# Patient Record
Sex: Female | Born: 1990 | Race: White | Hispanic: No | Marital: Single | Smoking: Never smoker
Health system: Southern US, Community
[De-identification: ages and names within clinical notes are randomized; demographics above are authoritative.]

## PROBLEM LIST (undated history)

## (undated) DIAGNOSIS — G43909 Migraine, unspecified, not intractable, without status migrainosus: Secondary | ICD-10-CM

## (undated) HISTORY — DX: Migraine, unspecified, not intractable, without status migrainosus: G43.909

## (undated) HISTORY — PX: WISDOM TOOTH EXTRACTION: SHX21

---
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
## 2012-04-06 DIAGNOSIS — M79676 Pain in unspecified toe(s): Secondary | ICD-10-CM | POA: Insufficient documentation

## 2012-04-06 DIAGNOSIS — L600 Ingrowing nail: Secondary | ICD-10-CM | POA: Insufficient documentation

## 2015-04-26 ENCOUNTER — Encounter: Payer: Self-pay | Admitting: Family Medicine

## 2015-04-26 ENCOUNTER — Ambulatory Visit (INDEPENDENT_AMBULATORY_CARE_PROVIDER_SITE_OTHER): Payer: 59 | Admitting: Family Medicine

## 2015-04-26 VITALS — BP 112/68 | HR 88 | Ht 60.0 in | Wt 148.4 lb

## 2015-04-26 DIAGNOSIS — Z7189 Other specified counseling: Secondary | ICD-10-CM | POA: Diagnosis not present

## 2015-04-26 DIAGNOSIS — G4700 Insomnia, unspecified: Secondary | ICD-10-CM | POA: Diagnosis not present

## 2015-04-26 DIAGNOSIS — G43919 Migraine, unspecified, intractable, without status migrainosus: Secondary | ICD-10-CM | POA: Diagnosis not present

## 2015-04-26 DIAGNOSIS — Z7689 Persons encountering health services in other specified circumstances: Secondary | ICD-10-CM

## 2015-04-26 MED ORDER — ZOLPIDEM TARTRATE 5 MG PO TABS: 5.0000 mg | ORAL_TABLET | Freq: Every evening | ORAL | Status: DC | PRN

## 2015-04-26 NOTE — Progress Notes (Signed)
Name: Candice Bernard   MRN: [id number]    DOB: 1991-01-31   Date:04/26/2015       Progress Note  Subjective  Chief Complaint  Chief Complaint  Patient presents with  . New Evaluation    previously seen at [facility]  . Migraine    currently seeing Dr. Bobby Rumpf, neurologist in [place]    Migraine  This is a chronic problem. The current episode started more than 1 year ago. The problem occurs intermittently. The problem has been unchanged. The pain is located in the occipital, frontal and bilateral region. The quality of the pain is described as throbbing. The pain is at a severity of 8/10. The pain is moderate. Associated symptoms include insomnia, phonophobia and photophobia. Pertinent negatives include no abdominal pain, back pain, blurred vision, coughing, dizziness, ear pain, eye watering, fever, nausea, neck pain, sore throat, tingling, tinnitus, visual change or weight loss. The symptoms are aggravated by food and fatigue (insomnia). She has tried antidepressants and triptans (topamax/ muscle relaxant) for the symptoms. The treatment provided moderate relief. Her past medical history is significant for migraine headaches.    No problem-specific assessment & plan notes found for this encounter.   Past Medical History  Diagnosis Date  . Migraine     Past Surgical History  Procedure Laterality Date  . Wisdom tooth extraction      Family History  Problem Relation Age of Onset  . Colitis Father     Social History   Social History  . Marital Status: Single    Spouse Name: N/A  . Number of Children: N/A  . Years of Education: N/A   Occupational History  . Not on file.   Social History Main Topics  . Smoking status: Never Smoker   . Smokeless tobacco: Not on file  . Alcohol Use: 0.0 oz/week    0 Standard drinks or equivalent per week     Comment: rare  . Drug Use: No  . Sexual Activity: Not on file   Other Topics Concern  . Not on file    Social History Narrative    No Known Allergies   Review of Systems  Constitutional: Negative for fever, chills, weight loss and malaise/fatigue.  HENT: Negative for ear discharge, ear pain, sore throat and tinnitus.   Eyes: Positive for photophobia. Negative for blurred vision.  Respiratory: Negative for cough, sputum production, shortness of breath and wheezing.   Cardiovascular: Negative for chest pain, palpitations and leg swelling.  Gastrointestinal: Negative for heartburn, nausea, abdominal pain, diarrhea, constipation, blood in stool and melena.  Genitourinary: Negative for dysuria, urgency, frequency and hematuria.  Musculoskeletal: Negative for myalgias, back pain, joint pain and neck pain.  Skin: Negative for rash.  Neurological: Negative for dizziness, tingling, sensory change, focal weakness and headaches.  Endo/Heme/Allergies: Negative for environmental allergies and polydipsia. Does not bruise/bleed easily.  Psychiatric/Behavioral: Negative for depression and suicidal ideas. The patient has insomnia. The patient is not nervous/anxious.      Objective  Filed Vitals:   04/26/15 1441  BP: 112/68  Pulse: 88  Height: 5' (1.524 m)  Weight: 148 lb 6.4 oz (67.314 kg)    Physical Exam  Constitutional: She is well-developed, well-nourished, and in no distress. No distress.  HENT:  Head: Normocephalic and atraumatic.  Right Ear: External ear normal.  Left Ear: External ear normal.  Nose: Nose normal.  Mouth/Throat: Oropharynx is clear and moist.  Eyes: Conjunctivae and EOM are normal. Pupils are equal, round, and  reactive to light. Right eye exhibits no discharge. Left eye exhibits no discharge.  Neck: Normal range of motion. Neck supple. No JVD present. No thyromegaly present.  Cardiovascular: Normal rate, regular rhythm, normal heart sounds and intact distal pulses.  Exam reveals no gallop and no friction rub.   No murmur heard. Pulmonary/Chest: Effort normal and  breath sounds normal.  Abdominal: Soft. Bowel sounds are normal. She exhibits no mass. There is no tenderness. There is no guarding.  Musculoskeletal: Normal range of motion. She exhibits no edema.  Lymphadenopathy:    She has no cervical adenopathy.  Neurological: She is alert. She has normal reflexes.  Skin: Skin is warm and dry. She is not diaphoretic.  Psychiatric: Mood and affect normal.  Nursing note and vitals reviewed.     Assessment & Plan  Problem List Items Addressed This Visit    None    Visit Diagnoses    Encounter to establish care with new doctor    -  Primary    shift change        Relevant Medications    zolpidem (AMBIEN) 5 MG tablet    Insomnia        Relevant Medications    zolpidem (AMBIEN) 5 MG tablet         Dr. Macon [facility]  04/26/2015

## 2015-05-21 DIAGNOSIS — G44219 Episodic tension-type headache, not intractable: Secondary | ICD-10-CM | POA: Diagnosis not present

## 2015-05-21 DIAGNOSIS — G43009 Migraine without aura, not intractable, without status migrainosus: Secondary | ICD-10-CM | POA: Diagnosis not present

## 2015-06-29 ENCOUNTER — Ambulatory Visit (INDEPENDENT_AMBULATORY_CARE_PROVIDER_SITE_OTHER): Payer: 59 | Admitting: Family Medicine

## 2015-06-29 ENCOUNTER — Encounter: Payer: Self-pay | Admitting: Family Medicine

## 2015-06-29 VITALS — BP 130/80 | HR 88 | Ht 60.0 in | Wt 148.0 lb

## 2015-06-29 DIAGNOSIS — G4700 Insomnia, unspecified: Secondary | ICD-10-CM

## 2015-06-29 MED ORDER — ZOLPIDEM TARTRATE 5 MG PO TABS: 5.0000 mg | ORAL_TABLET | Freq: Every evening | ORAL | Status: DC | PRN

## 2015-06-29 NOTE — Progress Notes (Signed)
Name: Candice Bernard   MRN: [id number]    DOB: [birth year]   Date:06/29/2015       Progress Note  Subjective  Chief Complaint  Chief Complaint  Patient presents with  . Insomnia    follow up to starting Ambien- "still not sleeping well"    Insomnia Primary symptoms: fragmented sleep, premature morning awakening, malaise/fatigue.  The current episode started more than one month. The onset quality is gradual. The problem occurs intermittently. The symptoms are aggravated by work stress (12 hour shifts with shift change effects). The treatment provided mild Lorrin Mais works but need more frequency) relief. How long after going to bed to you fall asleep: 15-30 minutes.   PMH includes: no depression, chronic pain.    No problem-specific assessment & plan notes found for this encounter.   Past Medical History  Diagnosis Date  . Migraine     Past Surgical History  Procedure Laterality Date  . Wisdom tooth extraction      Family History  Problem Relation Age of Onset  . Colitis Father     Social History   Social History  . Marital Status: Single    Spouse Name: N/A  . Number of Children: N/A  . Years of Education: N/A   Occupational History  . Not on file.   Social History Main Topics  . Smoking status: Never Smoker   . Smokeless tobacco: Not on file  . Alcohol Use: 0.0 oz/week    0 Standard drinks or equivalent per week     Comment: rare  . Drug Use: No  . Sexual Activity: No   Other Topics Concern  . Not on file   Social History Narrative    No Known Allergies   Review of Systems  Constitutional: Positive for malaise/fatigue. Negative for fever, chills and weight loss.  HENT: Negative for ear discharge, ear pain and sore throat.   Eyes: Negative for blurred vision.  Respiratory: Negative for cough, sputum production, shortness of breath and wheezing.   Cardiovascular: Negative for chest pain, palpitations and leg swelling.  Gastrointestinal: Negative  for heartburn, nausea, abdominal pain, diarrhea, constipation, blood in stool and melena.  Genitourinary: Negative for dysuria, urgency, frequency and hematuria.  Musculoskeletal: Negative for myalgias, back pain, joint pain and neck pain.  Skin: Negative for rash.  Neurological: Negative for dizziness, tingling, sensory change, focal weakness and headaches.  Endo/Heme/Allergies: Negative for environmental allergies and polydipsia. Does not bruise/bleed easily.  Psychiatric/Behavioral: Negative for depression and suicidal ideas. The patient has insomnia. The patient is not nervous/anxious.      Objective  Filed Vitals:   06/29/15 0905  BP: 130/80  Pulse: 88  Height: 5' (1.524 m)  Weight: 148 lb (67.132 kg)    Physical Exam  Constitutional: She is well-developed, well-nourished, and in no distress. No distress.  HENT:  Head: Normocephalic and atraumatic.  Right Ear: External ear normal.  Left Ear: External ear normal.  Nose: Nose normal.  Mouth/Throat: Oropharynx is clear and moist.  Eyes: Conjunctivae and EOM are normal. Pupils are equal, round, and reactive to light. Right eye exhibits no discharge. Left eye exhibits no discharge.  Neck: Normal range of motion. Neck supple. No JVD present. No thyromegaly present.  Cardiovascular: Normal rate, regular rhythm, normal heart sounds and intact distal pulses.  Exam reveals no gallop and no friction rub.   No murmur heard. Pulmonary/Chest: Effort normal and breath sounds normal.  Abdominal: Soft. Bowel sounds are normal. She exhibits no mass.  There is no tenderness. There is no guarding.  Musculoskeletal: Normal range of motion. She exhibits no edema.  Lymphadenopathy:    She has no cervical adenopathy.  Neurological: She is alert.  Skin: Skin is warm and dry. She is not diaphoretic.  Psychiatric: Mood and affect normal.  Nursing note and vitals reviewed.     Assessment & Plan  Problem List Items Addressed This Visit    None     Visit Diagnoses    Insomnia    -  Primary    shift change    Relevant Medications    zolpidem (AMBIEN) 5 MG tablet    shift change        Relevant Medications    zolpidem (AMBIEN) 5 MG tablet      Called pharmacy and d/c Ambien 5mg  # 6 per 30 days. Pt to get neurologist to take this med over   Dr. Otilio Miu [facility]  06/29/2015

## 2015-08-10 DIAGNOSIS — G43019 Migraine without aura, intractable, without status migrainosus: Secondary | ICD-10-CM | POA: Diagnosis not present

## 2015-08-10 DIAGNOSIS — G44219 Episodic tension-type headache, not intractable: Secondary | ICD-10-CM | POA: Diagnosis not present

## 2015-08-10 DIAGNOSIS — G4700 Insomnia, unspecified: Secondary | ICD-10-CM | POA: Diagnosis not present

## 2015-10-19 DIAGNOSIS — G44219 Episodic tension-type headache, not intractable: Secondary | ICD-10-CM | POA: Diagnosis not present

## 2015-10-19 DIAGNOSIS — G43009 Migraine without aura, not intractable, without status migrainosus: Secondary | ICD-10-CM | POA: Diagnosis not present

## 2015-11-29 ENCOUNTER — Other Ambulatory Visit: Payer: Self-pay

## 2015-11-29 DIAGNOSIS — Z3041 Encounter for surveillance of contraceptive pills: Secondary | ICD-10-CM

## 2015-11-29 MED ORDER — LEVONORGEST-ETH ESTRAD 91-DAY 0.15-0.03 MG PO TABS: 1.0000 | ORAL_TABLET | Freq: Every day | ORAL | 0 refills | Status: DC

## 2015-12-20 DIAGNOSIS — G43019 Migraine without aura, intractable, without status migrainosus: Secondary | ICD-10-CM | POA: Diagnosis not present

## 2015-12-20 DIAGNOSIS — G44219 Episodic tension-type headache, not intractable: Secondary | ICD-10-CM | POA: Diagnosis not present

## 2016-01-23 ENCOUNTER — Other Ambulatory Visit: Payer: Self-pay | Admitting: Family Medicine

## 2016-01-23 DIAGNOSIS — Z3041 Encounter for surveillance of contraceptive pills: Secondary | ICD-10-CM

## 2016-01-25 ENCOUNTER — Telehealth: Payer: Self-pay | Admitting: Emergency Medicine

## 2016-01-25 ENCOUNTER — Ambulatory Visit
Admission: EM | Admit: 2016-01-25 | Discharge: 2016-01-25 | Disposition: A | Discharge status: Home / Self Care | Payer: 59 | Attending: Family Medicine | Admitting: Family Medicine

## 2016-01-25 DIAGNOSIS — R197 Diarrhea, unspecified: Secondary | ICD-10-CM

## 2016-01-25 DIAGNOSIS — R319 Hematuria, unspecified: Secondary | ICD-10-CM

## 2016-01-25 DIAGNOSIS — N390 Urinary tract infection, site not specified: Secondary | ICD-10-CM

## 2016-01-25 LAB — URINALYSIS COMPLETE WITH MICROSCOPIC (ARMC ONLY)
Bilirubin Urine: NEGATIVE
GLUCOSE, UA: NEGATIVE mg/dL
KETONES UR: NEGATIVE mg/dL
LEUKOCYTES UA: NEGATIVE
NITRITE: NEGATIVE
Protein, ur: NEGATIVE mg/dL
SPECIFIC GRAVITY, URINE: 1.01 (ref 1.005–1.030)
pH: 6 (ref 5.0–8.0)

## 2016-01-25 LAB — CBC WITH DIFFERENTIAL/PLATELET
BASOS ABS: 0 10*3/uL (ref 0–0.1)
Basophils Relative: 1 %
EOS PCT: 1 %
Eosinophils Absolute: 0.1 10*3/uL (ref 0–0.7)
HEMATOCRIT: 38.5 % (ref 35.0–47.0)
Hemoglobin: 12.8 g/dL (ref 12.0–16.0)
LYMPHS PCT: 30 %
Lymphs Abs: 1.6 10*3/uL (ref 1.0–3.6)
MCH: 28.5 pg (ref 26.0–34.0)
MCHC: 33.2 g/dL (ref 32.0–36.0)
MCV: 85.9 fL (ref 80.0–100.0)
MONO ABS: 0.6 10*3/uL (ref 0.2–0.9)
MONOS PCT: 12 %
Neutro Abs: 3 10*3/uL (ref 1.4–6.5)
Neutrophils Relative %: 56 %
PLATELETS: 250 10*3/uL (ref 150–440)
RBC: 4.49 MIL/uL (ref 3.80–5.20)
RDW: 13.6 % (ref 11.5–14.5)
WBC: 5.2 10*3/uL (ref 3.6–11.0)

## 2016-01-25 LAB — COMPREHENSIVE METABOLIC PANEL
ALBUMIN: 3.6 g/dL (ref 3.5–5.0)
ALK PHOS: 56 U/L (ref 38–126)
ALT: 20 U/L (ref 14–54)
ANION GAP: 8 (ref 5–15)
AST: 24 U/L (ref 15–41)
BILIRUBIN TOTAL: 0.5 mg/dL (ref 0.3–1.2)
CALCIUM: 8.4 mg/dL — AB (ref 8.9–10.3)
CO2: 23 mmol/L (ref 22–32)
CREATININE: 0.72 mg/dL (ref 0.44–1.00)
Chloride: 103 mmol/L (ref 101–111)
GFR calc Af Amer: >60 mL/min (ref >60)
GFR calc non Af Amer: >60 mL/min (ref >60)
GLUCOSE: 97 mg/dL (ref 65–99)
Potassium: 3.6 mmol/L (ref 3.5–5.1)
SODIUM: 134 mmol/L — AB (ref 135–145)
TOTAL PROTEIN: 7.3 g/dL (ref 6.5–8.1)

## 2016-01-25 LAB — GASTROINTESTINAL PANEL BY PCR, STOOL (REPLACES STOOL CULTURE)
ADENOVIRUS F40/41: NOT DETECTED
ASTROVIRUS: NOT DETECTED
CYCLOSPORA CAYETANENSIS: NOT DETECTED
Campylobacter species: DETECTED — AB
Cryptosporidium: NOT DETECTED
ENTAMOEBA HISTOLYTICA: NOT DETECTED
ENTEROAGGREGATIVE E COLI (EAEC): NOT DETECTED
ENTEROPATHOGENIC E COLI (EPEC): NOT DETECTED
ENTEROTOXIGENIC E COLI (ETEC): NOT DETECTED
GIARDIA LAMBLIA: NOT DETECTED
NOROVIRUS GI/GII: NOT DETECTED
Plesimonas shigelloides: NOT DETECTED
Rotavirus A: NOT DETECTED
SAPOVIRUS (I, II, IV, AND V): NOT DETECTED
SHIGA LIKE TOXIN PRODUCING E COLI (STEC): NOT DETECTED
Salmonella species: NOT DETECTED
Shigella/Enteroinvasive E coli (EIEC): NOT DETECTED
VIBRIO CHOLERAE: NOT DETECTED
VIBRIO SPECIES: NOT DETECTED
Yersinia enterocolitica: NOT DETECTED

## 2016-01-25 LAB — PREGNANCY, URINE: PREG TEST UR: NEGATIVE

## 2016-01-25 LAB — C DIFFICILE QUICK SCREEN W PCR REFLEX
C DIFFICLE (CDIFF) ANTIGEN: NEGATIVE
C Diff interpretation: NOT DETECTED
C Diff toxin: NEGATIVE

## 2016-01-25 LAB — LIPASE, BLOOD: Lipase: 17 U/L (ref 11–51)

## 2016-01-25 MED ORDER — ONDANSETRON 4 MG PO TBDP: 4.0000 mg | ORAL_TABLET | Freq: Three times a day (TID) | ORAL | 0 refills | Status: DC | PRN

## 2016-01-25 MED ORDER — CIPROFLOXACIN HCL 500 MG PO TABS: 500.0000 mg | ORAL_TABLET | Freq: Two times a day (BID) | ORAL | 0 refills | Status: AC

## 2016-01-25 NOTE — Discharge Instructions (Signed)
Take medication as prescribed. Rest. Drink plenty of fluids. Follow BRAT diet.  Follow up with your primary care physician this week. Return to Urgent care or ER for new or worsening concerns.

## 2016-01-25 NOTE — Telephone Encounter (Signed)
Called and discussed laboratory results with patient. Patient c.diff negative. As previously discussed, we were awaiting C. difficile results to determine antibiotic use. Will place patient on oral Cipro as concerned for a urinary tract infection as well as infectious diarrhea. Encouraged supportive care. Patient prescription sent to patient pharmacy of request of [facility] in [place].Discussed indication, risks and benefits of medications with patient.

## 2016-01-25 NOTE — ED Triage Notes (Addendum)
Pt c/o diarrhea, abd. Pain in the LLQ, for the last 4 days. She states that before she has BM, during and after about a 10 minute time frame she has sharp pain. Last BM about an hour ago and it was watery and mucus.

## 2016-01-25 NOTE — ED Provider Notes (Signed)
[facility] ____________________________________________  Time seen: Approximately 12:36 PM  I have reviewed the triage vital signs and the nursing notes.   HISTORY  Chief Complaint Diarrhea and Abdominal Pain   HPI Candice Bernard is a 25 y.o. female presenting for the complaints diarrhea. Patient reports that she has had diarrhea for the last 5 days. Patient reports she does often have loose stools up to twice a day for the last several months, but reports since Monday she has had multiple episodes of diarrhea daily. Patient reports Monday Tuesday and Wednesday having greater than 10 episodes of diarrhea daily. Patient reports in the last 2 days she is still having approximately 6 episodes of diarrhea but states she started taking over-the-counter Imodium which helped some. Patient reports that Monday and Tuesday her diarrhea was a yellowish mucousy color, but states more recently her stool has been more of a brownish color. Denies any blood in stool or toilet. Denies dark-colored stools.  Patient reports that Tuesday she did have 2 episodes of vomiting, but none since. Patient reports she has felt nauseated throughout the week. Denies any others at home recently with similar. Denies any suspected food or fluid triggers. 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  Reports continues to drink fluids well and appetite has improved. Denies fevers. Patient reports she does have intermittent left lower quadrant abdominal pain. Patient states she has a generalized minimal abdominal discomfort throughout this week, but states around the time that she feels like she has to have a diarrhea episode she begins to have sharper abdominal pain and left lower quadrant that lasts for a few minutes after bowel movement. Otherwise denies any increase in abdominal pain.  Denies previous abdominal issues. Denies any  abdominal surgeries. Denies pregnancy in the past. Denies chance presents at this time. Patient reports not sexually active at this time. Denies dysuria, vaginal complaints, vaginal discharge, pelvic pain, dizziness, weakness, extremity pain or extremity swelling. States already taking probiotics as well.   Otilio Miu, MDPCP Patient's last menstrual period was 12/23/2015. Patient reports that she takes 3 month oral contraceptive packs, with last menstrual one month ago, consistent with her normal menstrual was in her birth control pack.   Past Medical History:  Diagnosis Date  . Migraine     There are no active problems to display for this patient.   Past Surgical History:  Procedure Laterality Date  . WISDOM TOOTH EXTRACTION      Current Outpatient Rx  . Order #: [id number] Class: Historical Med  . Order #: [id number] Class: Historical Med  . Order #: [id number] Class: Historical Med  . Order #: [id number] Class: Normal  . Order #: [id number] Class: Historical Med  . Order #: [id number] Class: Historical Med  . Order #: [id number] Class: Historical Med  . Order #: [id number] Class: Print  . Order #: [id number] Class: Normal    No current facility-administered medications for this encounter.   Current Outpatient Prescriptions:  .  chlorzoxazone (PARAFON) 500 MG tablet, Take 500 mg by mouth every 6 (six) hours as needed for muscle spasms., Disp: , Rfl:  .  fluticasone (FLONASE) 50 MCG/ACT nasal spray, Place 2 sprays into both nostrils daily., Disp: , Rfl:  .  ketoprofen (ORUDIS) 75 MG capsule, Take 75 mg by mouth every 8 (eight) hours as needed., Disp: , Rfl:  .  levonorgestrel-ethinyl estradiol (SEASONALE,INTROVALE,JOLESSA) 0.15-0.03 MG tablet, TAKE 1 TABLET BY MOUTH  DAILY, Disp: 3 Package, Rfl: 0 .  nortriptyline (PAMELOR) 10 MG capsule, Take 20 mg by mouth 2 (two) times daily., Disp: , Rfl:  .  rizatriptan (MAXALT) 10 MG tablet, Take 10 mg by mouth as needed for migraine. May repeat in 2  hours if needed, Disp: , Rfl:  .  topiramate (TOPAMAX) 100 MG tablet, Take 125 mg by mouth daily., Disp: , Rfl:  .  zolpidem (AMBIEN) 5 MG tablet, Take 1 tablet (5 mg total) by mouth at bedtime as needed for sleep (12 per 30 days). (12 per 30 day), Disp: 12 tablet, Rfl: 5 .  ondansetron (ZOFRAN ODT) 4 MG disintegrating tablet, Take 1 tablet (4 mg total) by mouth every 8 (eight) hours as needed for nausea or vomiting., Disp: 15 tablet, Rfl: 0  Allergies Review of patient's allergies indicates no known allergies.  Family History  Problem Relation Age of Onset  . Colitis Father     Social History Social History  Substance Use Topics  . Smoking status: Never Smoker  . Smokeless tobacco: Never Used  . Alcohol use 0.0 oz/week     Comment: rare    Review of Systems Constitutional: No fever/chills Eyes: No visual changes. ENT: No sore throat. Cardiovascular: Denies chest pain. Respiratory: Denies shortness of breath. Gastrointestinal: AS above.  Genitourinary: Negative for dysuria. Musculoskeletal: Negative for back pain. Skin: Negative for rash. Neurological: Negative for headaches, focal weakness or numbness.  10-point ROS otherwise negative.  ____________________________________________   PHYSICAL EXAM:  VITAL SIGNS: ED Triage Vitals  Enc Vitals Group     BP 01/25/16 1201 130/83     Pulse Rate 01/25/16 1201 99     Resp 01/25/16 1201 18     Temp 01/25/16 1201 98.8 F (37.1 C)     Temp Source 01/25/16 1201 Oral     SpO2 01/25/16 1201 100 %     Weight 01/25/16 1201 140 lb (63.5 kg)     Height 01/25/16 1201 5' (1.524 m)     Head Circumference --      Peak Flow --      Pain Score 01/25/16 1203 3     Pain Loc --      Pain Edu? --      Excl. in [place]? --     Constitutional: Alert and oriented. Well appearing and in no acute distress. Eyes: Conjunctivae are normal. PERRL. EOMI. ENT      Head: Normocephalic and atraumatic.      Mouth/Throat: Mucous membranes are  moist.Oropharynx non-erythematous. Cardiovascular: Normal rate, regular rhythm. Grossly normal heart sounds.  Good peripheral circulation. Respiratory: Normal respiratory effort without tachypnea nor retractions. Breath sounds are clear and equal bilaterally. No wheezes/rales/rhonchi.. Gastrointestinal: Minimal left lower quadrant abdominal tenderness to palpation, abdomen otherwise Soft and nontender. No distention. Normal Bowel sounds.  Musculoskeletal:  Ambulatory with steady gait.  Neurologic:  Normal speech and language. No gross focal neurologic deficits are appreciated. Speech is normal. No gait instability.  Skin:  Skin is warm, dry and intact. No rash noted. Psychiatric: Mood and affect are normal. Speech and behavior are normal. Patient exhibits appropriate insight and judgment   ___________________________________________   LABS (all labs ordered are listed, but only abnormal results are displayed)  Labs Reviewed  URINALYSIS COMPLETEWITH MICROSCOPIC ([place]) - Abnormal; Notable for the following:       Result Value   Hgb urine dipstick MODERATE (*)    Bacteria, UA MANY (*)    Squamous Epithelial / LPF 0-5 (*)    All other  components within normal limits  COMPREHENSIVE METABOLIC PANEL - Abnormal; Notable for the following:    Sodium 134 (*)    BUN <5 (*)    Calcium 8.4 (*)    All other components within normal limits  GASTROINTESTINAL PANEL BY PCR, STOOL (REPLACES STOOL CULTURE)  C DIFFICILE QUICK SCREEN W PCR REFLEX  URINE CULTURE  PREGNANCY, URINE  CBC WITH DIFFERENTIAL/PLATELET  LIPASE, BLOOD   ____________________________________________   PROCEDURES Procedures    INITIAL IMPRESSION / ASSESSMENT AND PLAN / ED COURSE  Pertinent labs & imaging results that were available during my care of the patient were reviewed by me and considered in my medical decision making (see chart for details).  Overall very well-appearing patient. No acute distress. Presents  for the complaints of 5 days of diarrhea. Patient also reports she did have some nausea and vomiting as well as some intermittent abdominal pain. Patient has minimal left lower quadrant abdominal pain and further states pain is just more of an aggravation than a pain. Discussed in detail with patient will evaluate CBC, CMP, lipase, urinalysis as well as recommend evaluating stool. Will evaluate GI panel as well as for C. Difficile.  Labs reviewed and discussed with patient. Urinalysis positive for many bacteria and moderate hemoglobin, concern for urinary tract infection, however patient denies dysuria and denies vaginal complaints, will culture urine. Patient was able to provide stool samples in urgent care. Spoke with lab and was informed that we should have C. difficile result in 2-3 hours. Discussed in detail with patient, will await C. difficile results prior to initiating treatment. Encouraged supportive care. When necessary Zofran. Encouraged [employer]. Encourage PCP follow up. Discussed in detail with patient if abdominal pain increases her symptoms do not change or unable to tolerate food or fluids or worsening concerns proceed directly to the emergency room.  Discussed follow up with Primary care physician this week. Discussed follow up and return parameters including no resolution or any worsening concerns. Patient verbalized understanding and agreed to plan.   ____________________________________________   FINAL CLINICAL IMPRESSION(S) / ED DIAGNOSES  Final diagnoses:  Diarrhea, unspecified type  Urinary tract infection with hematuria, site unspecified     Discharge Medication List as of 01/25/2016  2:09 PM    START taking these medications   Details  ondansetron (ZOFRAN ODT) 4 MG disintegrating tablet Take 1 tablet (4 mg total) by mouth every 8 (eight) hours as needed for nausea or vomiting., Starting Fri 01/25/2016, Normal        Note: This dictation was prepared with Dragon  dictation along with smaller phrase technology. Any transcriptional errors that result from this process are unintentional.    Clinical Course      Candice Land, NP 01/25/16 1503   Addendum: 01/25/2016 1745:   See telephone note entry. Patient's C. difficile negative. Patient called and discussed by telephone. Prescription for oral ciprofloxacin to patient's pharmacy.   Candice Land, NP 01/25/16 [phone]

## 2016-01-25 NOTE — Telephone Encounter (Signed)
Patient called and resulted of GI panel results including positive Campylobacter bacteria. Discussed in detail with patient. Will continue treatment with oral Cipro and supportive care. Discussed strict follow-up and return parameters.

## 2016-01-26 LAB — URINE CULTURE: Culture: <10000 — AB

## 2016-01-27 ENCOUNTER — Telehealth: Payer: Self-pay

## 2016-01-27 NOTE — Telephone Encounter (Signed)
Courtesy call back completed today after patients visit at [facility]. Patient improved and will follow up with their PCP if symptoms continue or worsen.   

## 2016-02-18 DIAGNOSIS — G43009 Migraine without aura, not intractable, without status migrainosus: Secondary | ICD-10-CM | POA: Diagnosis not present

## 2016-02-18 DIAGNOSIS — G44219 Episodic tension-type headache, not intractable: Secondary | ICD-10-CM | POA: Diagnosis not present

## 2016-03-06 ENCOUNTER — Encounter: Payer: 59 | Admitting: Family Medicine

## 2016-03-07 ENCOUNTER — Ambulatory Visit (INDEPENDENT_AMBULATORY_CARE_PROVIDER_SITE_OTHER): Payer: 59 | Admitting: Family Medicine

## 2016-03-07 ENCOUNTER — Encounter: Payer: Self-pay | Admitting: Family Medicine

## 2016-03-07 VITALS — BP 120/80 | HR 84 | Ht 60.0 in | Wt 149.0 lb

## 2016-03-07 DIAGNOSIS — Z0000 Encounter for general adult medical examination without abnormal findings: Secondary | ICD-10-CM | POA: Diagnosis not present

## 2016-03-07 DIAGNOSIS — Z3041 Encounter for surveillance of contraceptive pills: Secondary | ICD-10-CM

## 2016-03-07 DIAGNOSIS — Z23 Encounter for immunization: Secondary | ICD-10-CM

## 2016-03-07 MED ORDER — LEVONORGEST-ETH ESTRAD 91-DAY 0.15-0.03 MG PO TABS: 1.0000 | ORAL_TABLET | Freq: Every day | ORAL | 3 refills | Status: DC

## 2016-03-07 NOTE — Progress Notes (Signed)
Name: Candice Bernard   MRN: [id number]    DOB: Aug 11, 1990   Date:03/07/2016       Progress Note  Subjective  Chief Complaint  Chief Complaint  Patient presents with  . Annual Exam    had normal pap in 2015- doesn't need pap, just contraception refill    Patient presents for annual physical exam.      No problem-specific Assessment & Plan notes found for this encounter.   Past Medical History:  Diagnosis Date  . Migraine     Past Surgical History:  Procedure Laterality Date  . WISDOM TOOTH EXTRACTION      Family History  Problem Relation Age of Onset  . Colitis Father     Social History   Social History  . Marital status: Single    Spouse name: N/A  . Number of children: N/A  . Years of education: N/A   Occupational History  . Not on file.   Social History Main Topics  . Smoking status: Never Smoker  . Smokeless tobacco: Never Used  . Alcohol use 0.0 oz/week     Comment: rare  . Drug use: No  . Sexual activity: No   Other Topics Concern  . Not on file   Social History Narrative  . No narrative on file    No Known Allergies   Review of Systems  Constitutional: Negative for chills, fever, malaise/fatigue and weight loss.  HENT: Negative for congestion, ear discharge, ear pain, hearing loss, nosebleeds, sore throat and tinnitus.   Eyes: Negative for blurred vision, double vision, photophobia, pain, discharge and redness.  Respiratory: Negative for cough, hemoptysis, sputum production, shortness of breath, wheezing and stridor.   Cardiovascular: Negative for chest pain, palpitations, orthopnea, claudication, leg swelling and PND.  Gastrointestinal: Negative for abdominal pain, blood in stool, constipation, diarrhea, heartburn, melena, nausea and vomiting.  Genitourinary: Negative for dysuria, flank pain, frequency, hematuria and urgency.  Musculoskeletal: Negative for back pain, falls, joint pain, myalgias and neck pain.  Skin: Negative for  itching and rash.  Neurological: Negative for dizziness, tingling, tremors, sensory change, speech change, focal weakness, seizures, loss of consciousness and headaches.  Endo/Heme/Allergies: Negative for environmental allergies and polydipsia. Does not bruise/bleed easily.  Psychiatric/Behavioral: Negative for depression and suicidal ideas. The patient is not nervous/anxious and does not have insomnia.      Objective  Vitals:   03/07/16 0953  BP: 120/80  Pulse: 84  Weight: 149 lb (67.6 kg)  Height: 5' (1.524 m)    Physical Exam  Constitutional: She is well-developed, well-nourished, and in no distress. No distress.  HENT:  Head: Normocephalic and atraumatic.  Right Ear: Tympanic membrane, external ear and ear canal normal.  Left Ear: Tympanic membrane, external ear and ear canal normal.  Nose: Nose normal.  Mouth/Throat: Oropharynx is clear and moist.  Eyes: Conjunctivae, EOM and lids are normal. Right eye exhibits no discharge. Left eye exhibits no discharge.  Fundoscopic exam:      The right eye shows no arteriolar narrowing, no AV nicking and no papilledema.       The left eye shows no arteriolar narrowing, no AV nicking and no papilledema.  Neck: Trachea normal and normal range of motion. Neck supple. Normal carotid pulses, no hepatojugular reflux and no JVD present. Carotid bruit is not present. No thyromegaly present.  Cardiovascular: Normal rate, regular rhythm, S1 normal, S2 normal, normal heart sounds, intact distal pulses and normal pulses.  PMI is not displaced.  Exam  reveals no gallop, no S3, no S4 and no friction rub.   No murmur heard. Pulmonary/Chest: Effort normal and breath sounds normal. No accessory muscle usage. No respiratory distress. Right breast exhibits no inverted nipple, no mass, no nipple discharge, no skin change and no tenderness. Left breast exhibits no inverted nipple, no mass, no nipple discharge, no skin change and no tenderness. Breasts are  symmetrical.  Abdominal: Soft. Normal aorta and bowel sounds are normal. She exhibits no mass. There is no hepatosplenomegaly. There is no tenderness. There is no guarding and no CVA tenderness.  Musculoskeletal: Normal range of motion. She exhibits no edema.       Cervical back: Normal.       Thoracic back: Normal.       Lumbar back: Normal.  Lymphadenopathy:       Head (right side): No submental and no submandibular adenopathy present.       Head (left side): No submental and no submandibular adenopathy present.    She has no cervical adenopathy.    She has no axillary adenopathy.  Neurological: She is alert. She has normal sensation, normal strength, normal reflexes and intact cranial nerves.  Skin: Skin is warm, dry and intact. No rash noted. She is not diaphoretic.  Psychiatric: Mood and affect normal.  Nursing note and vitals reviewed.     Assessment & Plan  Problem List Items Addressed This Visit    None    Visit Diagnoses    Annual physical exam    -  Primary   Encounter for surveillance of contraceptive pills       Relevant Medications   levonorgestrel-ethinyl estradiol (SEASONALE,INTROVALE,JOLESSA) 0.15-0.03 MG tablet   Need for Tdap vaccination       Relevant Orders   Tdap vaccine greater than or equal to 7yo IM (Completed)        Dr. [facility]  03/07/16

## 2016-06-19 DIAGNOSIS — G44219 Episodic tension-type headache, not intractable: Secondary | ICD-10-CM | POA: Diagnosis not present

## 2016-06-19 DIAGNOSIS — G43009 Migraine without aura, not intractable, without status migrainosus: Secondary | ICD-10-CM | POA: Diagnosis not present

## 2016-10-27 DIAGNOSIS — G43009 Migraine without aura, not intractable, without status migrainosus: Secondary | ICD-10-CM | POA: Diagnosis not present

## 2016-10-27 DIAGNOSIS — G44219 Episodic tension-type headache, not intractable: Secondary | ICD-10-CM | POA: Diagnosis not present

## 2017-04-02 DIAGNOSIS — R51 Headache: Secondary | ICD-10-CM | POA: Diagnosis not present

## 2017-04-02 DIAGNOSIS — Z049 Encounter for examination and observation for unspecified reason: Secondary | ICD-10-CM | POA: Diagnosis not present

## 2017-04-02 DIAGNOSIS — G43719 Chronic migraine without aura, intractable, without status migrainosus: Secondary | ICD-10-CM | POA: Diagnosis not present

## 2017-04-02 DIAGNOSIS — Z79899 Other long term (current) drug therapy: Secondary | ICD-10-CM | POA: Diagnosis not present

## 2017-07-06 DIAGNOSIS — G43719 Chronic migraine without aura, intractable, without status migrainosus: Secondary | ICD-10-CM | POA: Diagnosis not present

## 2017-07-15 DIAGNOSIS — M542 Cervicalgia: Secondary | ICD-10-CM | POA: Diagnosis not present

## 2017-07-15 DIAGNOSIS — R51 Headache: Secondary | ICD-10-CM | POA: Diagnosis not present

## 2017-07-15 DIAGNOSIS — M791 Myalgia, unspecified site: Secondary | ICD-10-CM | POA: Diagnosis not present

## 2017-07-15 DIAGNOSIS — G43719 Chronic migraine without aura, intractable, without status migrainosus: Secondary | ICD-10-CM | POA: Diagnosis not present

## 2017-08-03 DIAGNOSIS — R51 Headache: Secondary | ICD-10-CM | POA: Diagnosis not present

## 2017-08-03 DIAGNOSIS — M542 Cervicalgia: Secondary | ICD-10-CM | POA: Diagnosis not present

## 2017-08-03 DIAGNOSIS — G43719 Chronic migraine without aura, intractable, without status migrainosus: Secondary | ICD-10-CM | POA: Diagnosis not present

## 2017-08-03 DIAGNOSIS — M791 Myalgia, unspecified site: Secondary | ICD-10-CM | POA: Diagnosis not present

## 2017-08-17 ENCOUNTER — Encounter: Payer: Self-pay | Admitting: Family Medicine

## 2017-08-17 ENCOUNTER — Ambulatory Visit (INDEPENDENT_AMBULATORY_CARE_PROVIDER_SITE_OTHER): Payer: 59 | Admitting: Family Medicine

## 2017-08-17 ENCOUNTER — Other Ambulatory Visit: Payer: Self-pay

## 2017-08-17 VITALS — BP 113/76 | HR 100 | Resp 16 | Ht 61.0 in | Wt 145.0 lb

## 2017-08-17 DIAGNOSIS — Z01419 Encounter for gynecological examination (general) (routine) without abnormal findings: Secondary | ICD-10-CM

## 2017-08-17 DIAGNOSIS — Z0000 Encounter for general adult medical examination without abnormal findings: Secondary | ICD-10-CM

## 2017-08-17 NOTE — Progress Notes (Signed)
Name: Candice Bernard   MRN: [id number]    DOB: [birth year]   Date:08/17/2017       Progress Note  Subjective  Chief Complaint  Chief Complaint  Patient presents with  . Annual Exam    Patient presents foe annual physical with pap and pelvic.   No problem-specific Assessment & Plan notes found for this encounter.   Past Medical History:  Diagnosis Date  . Migraine     Past Surgical History:  Procedure Laterality Date  . WISDOM TOOTH EXTRACTION      Family History  Problem Relation Age of Onset  . Colitis Father     Social History   Socioeconomic History  . Marital status: Single    Spouse name: Not on file  . Number of children: Not on file  . Years of education: Not on file  . Highest education level: Not on file  Occupational History  . Not on file  Social Needs  . Financial resource strain: Not on file  . Food insecurity:    Worry: Not on file    Inability: Not on file  . Transportation needs:    Medical: Not on file    Non-medical: Not on file  Tobacco Use  . Smoking status: Never Smoker  . Smokeless tobacco: Never Used  Substance and Sexual Activity  . Alcohol use: Yes    Alcohol/week: 0.0 oz    Comment: rare  . Drug use: No  . Sexual activity: Never  Lifestyle  . Physical activity:    Days per week: Not on file    Minutes per session: Not on file  . Stress: Not on file  Relationships  . Social connections:    Talks on phone: Not on file    Gets together: Not on file    Attends religious service: Not on file    Active member of club or organization: Not on file    Attends meetings of clubs or organizations: Not on file    Relationship status: Not on file  . Intimate partner violence:    Fear of current or ex partner: Not on file    Emotionally abused: Not on file    Physically abused: Not on file    Forced sexual activity: Not on file  Other Topics Concern  . Not on file  Social History Narrative  . Not on file    No Known  Allergies  Outpatient Medications Prior to Visit  Medication Sig Dispense Refill  . chlorzoxazone (PARAFON) 500 MG tablet Take 500 mg by mouth every 6 (six) hours as needed for muscle spasms. Dr Warden Fillers    . levETIRAcetam (KEPPRA) 1000 MG tablet Take 1,000 mg by mouth 2 (two) times daily.    Marland Kitchen levonorgestrel-ethinyl estradiol (JOLESSA) 0.15-0.03 MG tablet Take 1 tablet by mouth daily.    . flurbiprofen (ANSAID) 100 MG tablet Take 1 tablet by mouth 2 (two) times daily. Dr Warden Fillers  1  . fluticasone (FLONASE) 50 MCG/ACT nasal spray Place into the nose.    . ondansetron (ZOFRAN ODT) 4 MG disintegrating tablet Take 1 tablet (4 mg total) by mouth every 8 (eight) hours as needed for nausea or vomiting. (Patient taking differently: Take 4 mg by mouth every 8 (eight) hours as needed for nausea or vomiting. Urgent care) 15 tablet 0  . rizatriptan (MAXALT) 10 MG tablet Take 10 mg by mouth as needed for migraine. May repeat in 2 hours if needed - Dr Warden Fillers    .  SUMAtriptan (IMITREX) 100 MG tablet TAKE 1 TABLET AS NEEDED FOR MIGRAINE. MAY TAKE A SECOND DOSE AFTER 2 HOURS IF NEEDED    . topiramate (TOPAMAX) 100 MG tablet TAKE 1 TABLET NIGHTLY    . levETIRAcetam (KEPPRA XR) 500 MG 24 hr tablet Take 2 tablets by mouth daily. Dr Warden Fillers  1   No facility-administered medications prior to visit.     Review of Systems  Constitutional: Negative for chills, fever, malaise/fatigue and weight loss.  HENT: Negative for congestion, ear discharge, ear pain, hearing loss, nosebleeds, sinus pain, sore throat and tinnitus.   Eyes: Negative for blurred vision, double vision, photophobia, pain, discharge and redness.  Respiratory: Negative for cough, hemoptysis, sputum production, shortness of breath, wheezing and stridor.   Cardiovascular: Negative for chest pain, palpitations, orthopnea, claudication, leg swelling and PND.  Gastrointestinal: Negative for abdominal pain, blood in stool,  constipation, diarrhea, heartburn, melena, nausea and vomiting.  Genitourinary: Negative for dysuria, flank pain, frequency, hematuria and urgency.  Musculoskeletal: Negative for back pain, falls, joint pain, myalgias and neck pain.  Skin: Negative for itching and rash.  Neurological: Negative for dizziness, tingling, tremors, sensory change, speech change, focal weakness, seizures, loss of consciousness, weakness and headaches.  Endo/Heme/Allergies: Negative for environmental allergies and polydipsia. Does not bruise/bleed easily.  Psychiatric/Behavioral: Negative for depression and suicidal ideas. The patient is not nervous/anxious and does not have insomnia.      Objective  Vitals:   08/17/17 0944  BP: 113/76  Pulse: 100  Resp: 16  SpO2: 100%  Weight: 145 lb (65.8 kg)  Height: 5\' 1"  (1.549 m)    Physical Exam  Constitutional: She is oriented to person, place, and time. She appears well-developed and well-nourished.  HENT:  Head: Normocephalic and atraumatic.  Right Ear: Hearing, tympanic membrane, external ear and ear canal normal.  Left Ear: Hearing, tympanic membrane, external ear and ear canal normal.  Nose: Nose normal.  Mouth/Throat: Uvula is midline and oropharynx is clear and moist. No oropharyngeal exudate, posterior oropharyngeal edema or posterior oropharyngeal erythema.  Eyes: Pupils are equal, round, and reactive to light. Conjunctivae, EOM and lids are normal. Lids are everted and swept, no foreign bodies found. Left eye exhibits no hordeolum. No foreign body present in the left eye. Right conjunctiva is not injected. Left conjunctiva is not injected. No scleral icterus.  Fundoscopic exam:      The right eye shows no AV nicking.       The left eye shows no AV nicking.  Neck: Trachea normal and normal range of motion. Neck supple. Normal carotid pulses, no hepatojugular reflux and no JVD present. No tracheal tenderness present. Carotid bruit is not present. No  tracheal deviation present. No thyroid mass and no thyromegaly present.  Cardiovascular: Normal rate, regular rhythm, S1 normal, S2 normal and intact distal pulses. Exam reveals no gallop, no S3, no S4 and no friction rub.  Murmur heard.  Systolic murmur is present with a grade of 1/6. Pulses:      Carotid pulses are 2+ on the right side, and 2+ on the left side.      Radial pulses are 2+ on the right side, and 2+ on the left side.       Femoral pulses are 2+ on the right side, and 2+ on the left side.      Popliteal pulses are 2+ on the right side, and 2+ on the left side.       Dorsalis pedis pulses  are 2+ on the right side, and 2+ on the left side.       Posterior tibial pulses are 2+ on the right side, and 2+ on the left side.  Pulmonary/Chest: Effort normal and breath sounds normal. No respiratory distress. She has no wheezes. She has no rales. Right breast exhibits no inverted nipple, no mass, no nipple discharge, no skin change and no tenderness. Left breast exhibits no inverted nipple, no mass, no nipple discharge, no skin change and no tenderness. No breast swelling, tenderness, discharge or bleeding. Breasts are symmetrical.  Abdominal: Soft. Normal aorta and bowel sounds are normal. She exhibits no mass. There is no hepatosplenomegaly. There is no tenderness. There is no rebound and no guarding.  Genitourinary: Rectum normal, vagina normal and uterus normal. Rectal exam shows guaiac negative stool. Pelvic exam was performed with patient supine. There is no rash or lesion on the right labia. There is no rash or lesion on the left labia. Cervix exhibits no motion tenderness, no discharge and no friability. Right adnexum displays no mass, no tenderness and no fullness. Left adnexum displays no mass, no tenderness and no fullness.  Musculoskeletal: Normal range of motion. She exhibits no edema or tenderness.  Lymphadenopathy:       Head (right side): No submental and no submandibular  adenopathy present.       Head (left side): No submental and no submandibular adenopathy present.    She has no cervical adenopathy.    She has no axillary adenopathy.  Neurological: She is alert and oriented to person, place, and time. She has normal strength. She displays normal reflexes. No cranial nerve deficit or sensory deficit.  Skin: Skin is warm and intact. No rash noted.  Psychiatric: She has a normal mood and affect. Her mood appears not anxious. She does not exhibit a depressed mood.      Assessment & Plan  Problem List Items Addressed This Visit    None    Visit Diagnoses    Annual physical exam    -  Primary   Relevant Orders   Renal Function Panel   Lipid panel   Pap smear, as part of routine gynecological examination       Relevant Orders   Pap IG and HPV (high risk) DNA detection      No orders of the defined types were placed in this encounter.     Dr. [facility]  08/17/17

## 2017-08-18 LAB — RENAL FUNCTION PANEL
Albumin: 4.3 g/dL (ref 3.5–5.5)
BUN / CREAT RATIO: 11 (ref 9–23)
BUN: 7 mg/dL (ref 6–20)
CALCIUM: 9 mg/dL (ref 8.7–10.2)
CHLORIDE: 103 mmol/L (ref 96–106)
CO2: 23 mmol/L (ref 20–29)
Creatinine, Ser: 0.66 mg/dL (ref 0.57–1.00)
GFR calc Af Amer: 142 mL/min/{1.73_m2} (ref >59)
GFR calc non Af Amer: 123 mL/min/{1.73_m2} (ref >59)
Glucose: 78 mg/dL (ref 65–99)
PHOSPHORUS: 3.1 mg/dL (ref 2.5–4.5)
Potassium: 4.5 mmol/L (ref 3.5–5.2)
SODIUM: 142 mmol/L (ref 134–144)

## 2017-08-18 LAB — LIPID PANEL
CHOLESTEROL TOTAL: 165 mg/dL (ref 100–199)
Chol/HDL Ratio: 2.6 ratio (ref 0.0–4.4)
HDL: 63 mg/dL (ref >39)
LDL Calculated: 83 mg/dL (ref 0–99)
TRIGLYCERIDES: 96 mg/dL (ref 0–149)
VLDL Cholesterol Cal: 19 mg/dL (ref 5–40)

## 2017-08-19 LAB — PAP IG AND HPV HIGH-RISK
HPV, HIGH-RISK: NEGATIVE
PAP Smear Comment: 0

## 2017-08-28 DIAGNOSIS — G43719 Chronic migraine without aura, intractable, without status migrainosus: Secondary | ICD-10-CM | POA: Diagnosis not present

## 2017-08-28 DIAGNOSIS — G509 Disorder of trigeminal nerve, unspecified: Secondary | ICD-10-CM | POA: Diagnosis not present

## 2017-08-28 DIAGNOSIS — G500 Trigeminal neuralgia: Secondary | ICD-10-CM | POA: Diagnosis not present

## 2017-08-28 DIAGNOSIS — M542 Cervicalgia: Secondary | ICD-10-CM | POA: Diagnosis not present

## 2017-08-28 DIAGNOSIS — R51 Headache: Secondary | ICD-10-CM | POA: Diagnosis not present

## 2017-08-28 DIAGNOSIS — M791 Myalgia, unspecified site: Secondary | ICD-10-CM | POA: Diagnosis not present

## 2017-09-18 DIAGNOSIS — M542 Cervicalgia: Secondary | ICD-10-CM | POA: Diagnosis not present

## 2017-09-18 DIAGNOSIS — M791 Myalgia, unspecified site: Secondary | ICD-10-CM | POA: Diagnosis not present

## 2017-09-18 DIAGNOSIS — R51 Headache: Secondary | ICD-10-CM | POA: Diagnosis not present

## 2017-09-18 DIAGNOSIS — G43719 Chronic migraine without aura, intractable, without status migrainosus: Secondary | ICD-10-CM | POA: Diagnosis not present

## 2017-09-30 ENCOUNTER — Encounter: Payer: Self-pay | Admitting: Family Medicine

## 2017-09-30 ENCOUNTER — Ambulatory Visit (INDEPENDENT_AMBULATORY_CARE_PROVIDER_SITE_OTHER): Payer: 59 | Admitting: Family Medicine

## 2017-09-30 DIAGNOSIS — M222X1 Patellofemoral disorders, right knee: Secondary | ICD-10-CM | POA: Insufficient documentation

## 2017-09-30 DIAGNOSIS — M722 Plantar fascial fibromatosis: Secondary | ICD-10-CM | POA: Insufficient documentation

## 2017-09-30 NOTE — Assessment & Plan Note (Signed)
Patient is here with signs and symptoms consistent with plantar fasciitis of the left foot. -HEP: Focus on stretching -OTC NSAIDs -Regular icing, especially if using frozen water bottle technique. -Green shoe inserts to help with increased padding, encouraged OTC gel heel cups. -Follow-up as above

## 2017-09-30 NOTE — Progress Notes (Signed)
HPI  CC: Right knee pain and left heel pain Patient is here with complaints of right-sided lateral knee pain and left heel pain over the past few months.  Regarding patient's right knee pain she states that this only seems to give her issues when she is running.  She states that she is currently training for half marathon.  She denies any injury, trauma, or event which may have caused this pain.  Pain is located along the lateral aspect of the anterior knee.  Pain does not radiate.  She denies any mechanical locking, clicking, or catching.  No prior knee injuries.  Pain is not present when doing activities other than running (for example her "insanity classes" which involve repeated jumps twists and squats).  She denies any swelling weakness numbness or paresthesias in or around the knee.  Regarding patient's left heel she states that this also began a few months ago.  It is located to the plantar aspect of the heel.  It is worse with prolonged standing.  Pain does not radiate.  It is worse with direct pressure.  She denies any injury or trauma to this area.  She denies any weakness, numbness, or paresthesias.   Quality: Aching and sharp  Duration: 3-4 months  Timing: Jogging and prolonged standing Improving/Worsening: Worsening Makes better: Rest Makes worse: Activity Associated symptoms: None  Previous Interventions Tried: Different foot wear and relative rest  Past Injuries: None Past Surgeries: Noncontributory Smoking: Non-smoker Family Hx: Noncontributory  ROS: Per HPI; in addition no fever, no rash, no additional weakness, no additional numbness, no additional paresthesias, and no additional falls/injury.   Objective: BP 104/70   Ht 5' (1.524 m)   Wt 142 lb (64.4 kg)   BMI 27.73 kg/m  Gen: NAD, well groomed, a/o x3, normal affect.  CV: Well-perfused. Warm.  Resp: Non-labored.  Neuro: Sensation intact throughout. No gross coordination deficits.  Gait: Nonpathologic  posture, unremarkable stride without signs of limp or balance issues. Knee, Right: TTP noted at the lateral aspect of the inferior patella and lateral femoral condyle. Inspection was negative for erythema, ecchymosis, and effusion. No obvious bony abnormalities or signs of osteophyte development. Palpation yielded no asymmetric warmth; No joint line tenderness; No patellar crepitus. Patellar and quadriceps tendons unremarkable, and no tenderness of the pes anserine bursa. No obvious Baker's cyst development. ROM normal in flexion (135 degrees) and extension (0 degrees). Normal hamstring and quadriceps strength, but relative weakness in the hip abductors. Neurovascularly intact bilaterally.  - Ligaments: (Solid and consistent endpoints)   - ACL (present bilaterally)   - PCL (present bilaterally)   - LCL (present bilaterally)   - MCL (present bilaterally).   - Meniscus:   - Thessaly: NEG  - Patella:   - Patellar grind/compression: NEG   - Patellar glide: Without apprehension Ankle/Foot, Left: TTP noted at the medial heel on the plantar aspect -- plantar fascia. No visible erythema, swelling, ecchymosis, or bony deformity. No notable pes planus/cavus deformity. No evidence of tibiotalar deviation; Range of motion is full in all directions. Strength is 5/5 in all directions. No tenderness at the insertion/body/myotendinous junction of the Achilles tendon; No peroneal tendon tenderness or subluxation; No tenderness on posterior aspects of lateral and medial malleolus; Stable lateral and medial ligaments; Talar dome nontender; Unremarkable calcaneal squeeze; No tenderness at the distal metatarsals; Able to walk 4 steps.    Assessment and Plan:  Patellofemoral pain syndrome of right knee Patient is here with signs and symptoms consistent  with patellofemoral pain syndrome of the right knee.  No history of injury or trauma.  Differential includes possible stress reaction versus IT band syndrome.  Stress  reaction deemed unlikely at this time due to location of pain and lack of discomfort with other exercises.  Relative hip abductor weakness may contribute to additional stresses into the knee. -HEP: Focus on hip abductor strengthening -Green shoe inserts provided today to help with padding -RICE therapy as needed/tolerated -Follow-up in 4 to 6 weeks  Plantar fasciitis of left foot Patient is here with signs and symptoms consistent with plantar fasciitis of the left foot. -HEP: Focus on stretching -OTC NSAIDs -Regular icing, especially if using frozen water bottle technique. -Green shoe inserts to help with increased padding, encouraged OTC gel heel cups. -Follow-up as above   Elberta Leatherwood, MD,MS [facility] 09/30/2017 7:43 PM

## 2017-09-30 NOTE — Assessment & Plan Note (Signed)
Patient is here with signs and symptoms consistent with patellofemoral pain syndrome of the right knee.  No history of injury or trauma.  Differential includes possible stress reaction versus IT band syndrome.  Stress reaction deemed unlikely at this time due to location of pain and lack of discomfort with other exercises.  Relative hip abductor weakness may contribute to additional stresses into the knee. -HEP: Focus on hip abductor strengthening -Green shoe inserts provided today to help with padding -RICE therapy as needed/tolerated -Follow-up in 4 to 6 weeks

## 2017-10-05 DIAGNOSIS — M542 Cervicalgia: Secondary | ICD-10-CM | POA: Diagnosis not present

## 2017-10-05 DIAGNOSIS — G43719 Chronic migraine without aura, intractable, without status migrainosus: Secondary | ICD-10-CM | POA: Diagnosis not present

## 2017-10-05 DIAGNOSIS — R51 Headache: Secondary | ICD-10-CM | POA: Diagnosis not present

## 2017-10-05 DIAGNOSIS — M791 Myalgia, unspecified site: Secondary | ICD-10-CM | POA: Diagnosis not present

## 2017-10-19 DIAGNOSIS — R51 Headache: Secondary | ICD-10-CM | POA: Diagnosis not present

## 2017-10-19 DIAGNOSIS — G43719 Chronic migraine without aura, intractable, without status migrainosus: Secondary | ICD-10-CM | POA: Diagnosis not present

## 2017-10-19 DIAGNOSIS — M791 Myalgia, unspecified site: Secondary | ICD-10-CM | POA: Diagnosis not present

## 2017-10-19 DIAGNOSIS — M542 Cervicalgia: Secondary | ICD-10-CM | POA: Diagnosis not present

## 2017-11-26 DIAGNOSIS — J029 Acute pharyngitis, unspecified: Secondary | ICD-10-CM | POA: Diagnosis not present

## 2017-11-26 DIAGNOSIS — B9689 Other specified bacterial agents as the cause of diseases classified elsewhere: Secondary | ICD-10-CM | POA: Diagnosis not present

## 2017-11-26 DIAGNOSIS — J028 Acute pharyngitis due to other specified organisms: Secondary | ICD-10-CM | POA: Diagnosis not present

## 2017-11-26 DIAGNOSIS — J019 Acute sinusitis, unspecified: Secondary | ICD-10-CM | POA: Diagnosis not present

## 2017-12-07 DIAGNOSIS — R51 Headache: Secondary | ICD-10-CM | POA: Diagnosis not present

## 2017-12-07 DIAGNOSIS — G43719 Chronic migraine without aura, intractable, without status migrainosus: Secondary | ICD-10-CM | POA: Diagnosis not present

## 2017-12-07 DIAGNOSIS — M542 Cervicalgia: Secondary | ICD-10-CM | POA: Diagnosis not present

## 2017-12-07 DIAGNOSIS — M791 Myalgia, unspecified site: Secondary | ICD-10-CM | POA: Diagnosis not present

## 2018-01-18 DIAGNOSIS — M542 Cervicalgia: Secondary | ICD-10-CM | POA: Diagnosis not present

## 2018-01-18 DIAGNOSIS — G43719 Chronic migraine without aura, intractable, without status migrainosus: Secondary | ICD-10-CM | POA: Diagnosis not present

## 2018-01-18 DIAGNOSIS — R51 Headache: Secondary | ICD-10-CM | POA: Diagnosis not present

## 2018-01-18 DIAGNOSIS — M791 Myalgia, unspecified site: Secondary | ICD-10-CM | POA: Diagnosis not present

## 2018-04-20 DIAGNOSIS — G43719 Chronic migraine without aura, intractable, without status migrainosus: Secondary | ICD-10-CM | POA: Diagnosis not present

## 2018-04-20 DIAGNOSIS — M791 Myalgia, unspecified site: Secondary | ICD-10-CM | POA: Diagnosis not present

## 2018-04-20 DIAGNOSIS — M542 Cervicalgia: Secondary | ICD-10-CM | POA: Diagnosis not present

## 2018-04-20 DIAGNOSIS — R51 Headache: Secondary | ICD-10-CM | POA: Diagnosis not present

## 2018-06-24 ENCOUNTER — Ambulatory Visit: Payer: 59 | Admitting: Family Medicine

## 2018-06-24 ENCOUNTER — Encounter: Payer: Self-pay | Admitting: Family Medicine

## 2018-06-24 VITALS — BP 118/77 | HR 77 | Resp 16 | Ht 60.0 in | Wt 149.0 lb

## 2018-06-24 DIAGNOSIS — Z3041 Encounter for surveillance of contraceptive pills: Secondary | ICD-10-CM

## 2018-06-24 MED ORDER — LEVONORGEST-ETH ESTRAD 91-DAY 0.15-0.03 MG PO TABS: 1.0000 | ORAL_TABLET | Freq: Every day | ORAL | 8 refills | Status: DC

## 2018-06-24 NOTE — Progress Notes (Signed)
Date:  06/24/2018   Name:  Candice Bernard   DOB:  [birth year]   MRN:  [id number]   Chief Complaint: Contraception  Patient presents for ocp and contraceptive management.    Review of Systems  Constitutional: Negative.  Negative for chills, fatigue, fever and unexpected weight change.  HENT: Negative for congestion, ear discharge, ear pain, mouth sores, nosebleeds, postnasal drip, rhinorrhea, sinus pressure, sneezing and sore throat.   Eyes: Negative for photophobia, pain, discharge, redness and itching.  Respiratory: Negative for apnea, cough, choking, chest tightness, shortness of breath, wheezing and stridor.   Cardiovascular: Negative for chest pain, palpitations and leg swelling.  Gastrointestinal: Negative for abdominal pain, blood in stool, constipation, diarrhea, nausea and vomiting.  Endocrine: Negative for cold intolerance, heat intolerance, polydipsia, polyphagia and polyuria.  Genitourinary: Negative for dysuria, flank pain, frequency, hematuria, menstrual problem, pelvic pain, urgency, vaginal bleeding and vaginal discharge.  Musculoskeletal: Negative for arthralgias, back pain, myalgias and neck pain.  Skin: Negative for rash.  Allergic/Immunologic: Negative for environmental allergies and food allergies.  Neurological: Negative for dizziness, speech difficulty, weakness, light-headedness, numbness and headaches.  Hematological: Negative for adenopathy. Does not bruise/bleed easily.  Psychiatric/Behavioral: Negative for dysphoric mood. The patient is not nervous/anxious.     Patient Active Problem List   Diagnosis Date Noted  . Patellofemoral pain syndrome of right knee 09/30/2017  . Plantar fasciitis of left foot 09/30/2017  . Onychocryptosis 04/06/2012  . Pain in toe 04/06/2012  . Allergic rhinitis due to pollen 10/30/2010  . Dysmenorrhea 10/30/2010  . Migraine without aura 10/30/2010  . Tension type headache 10/30/2010    No Known Allergies  Past  Surgical History:  Procedure Laterality Date  . WISDOM TOOTH EXTRACTION      Social History   Tobacco Use  . Smoking status: Never Smoker  . Smokeless tobacco: Never Used  Substance Use Topics  . Alcohol use: Yes    Alcohol/week: 0.0 standard drinks    Comment: rare  . Drug use: No     Medication list has been reviewed and updated.  Current Meds  Medication Sig  . chlorzoxazone (PARAFON) 500 MG tablet Take 500 mg by mouth every 6 (six) hours as needed for muscle spasms. Dr Candice Bernard  . flurbiprofen (ANSAID) 100 MG tablet Take 1 tablet by mouth 2 (two) times daily. Dr Candice Bernard  . fluticasone (FLONASE) 50 MCG/ACT nasal spray Place into the nose.  . levETIRAcetam (KEPPRA) 1000 MG tablet Take 1,000 mg by mouth 2 (two) times daily.  Marland Kitchen levonorgestrel-ethinyl estradiol (JOLESSA) 0.15-0.03 MG tablet Take 1 tablet by mouth daily.  . rizatriptan (MAXALT) 10 MG tablet Take 10 mg by mouth as needed for migraine. May repeat in 2 hours if needed - Dr Candice Bernard    [facility] 2/9 Scores 06/24/2018 08/17/2017 06/29/2015  PHQ - 2 Score 0 0 0  PHQ- 9 Score 1 - -    Physical Exam Vitals signs and nursing note reviewed.  Constitutional:      General: She is not in acute distress.    Appearance: She is not diaphoretic.  HENT:     Head: Normocephalic and atraumatic.     Right Ear: External ear normal.     Left Ear: External ear normal.     Nose: Nose normal.  Eyes:     General:        Right eye: No discharge.        Left eye: No discharge.  Conjunctiva/sclera: Conjunctivae normal.     Pupils: Pupils are equal, round, and reactive to light.  Neck:     Musculoskeletal: Normal range of motion and neck supple.     Thyroid: No thyromegaly.     Vascular: No JVD.  Cardiovascular:     Rate and Rhythm: Normal rate and regular rhythm.     Heart sounds: Normal heart sounds. No murmur. No friction rub. No gallop.   Pulmonary:     Effort: Pulmonary effort is normal.     Breath sounds:  Normal breath sounds.  Abdominal:     General: Bowel sounds are normal.     Palpations: Abdomen is soft. There is no mass.     Tenderness: There is no abdominal tenderness. There is no guarding.  Musculoskeletal: Normal range of motion.  Lymphadenopathy:     Cervical: No cervical adenopathy.  Skin:    General: Skin is warm and dry.  Neurological:     Mental Status: She is alert.     Deep Tendon Reflexes: Reflexes are normal and symmetric.     BP 118/77   Pulse 77   Resp 16   Ht 5' (1.524 m)   Wt 149 lb (67.6 kg)   LMP 05/26/2018   SpO2 98%   BMI 29.10 kg/m   Assessment and Plan: 1. Encounter for surveillance of contraceptive pills Patient presents today for refill on her OCP.  We discussed maintenance of contraception as well as can benefits.  Candice Bernard was refilled for 2-year.  And patient will be obtaining Pap smears from her gynecologist. - levonorgestrel-ethinyl estradiol (JOLESSA) 0.15-0.03 MG tablet; Take 1 tablet by mouth daily.  Dispense: 1 Package; Refill: 8

## 2018-07-20 DIAGNOSIS — M542 Cervicalgia: Secondary | ICD-10-CM | POA: Diagnosis not present

## 2018-07-20 DIAGNOSIS — G43719 Chronic migraine without aura, intractable, without status migrainosus: Secondary | ICD-10-CM | POA: Diagnosis not present

## 2018-07-20 DIAGNOSIS — M791 Myalgia, unspecified site: Secondary | ICD-10-CM | POA: Diagnosis not present

## 2018-09-27 DIAGNOSIS — Z1159 Encounter for screening for other viral diseases: Secondary | ICD-10-CM | POA: Diagnosis not present

## 2018-09-27 DIAGNOSIS — Z01818 Encounter for other preprocedural examination: Secondary | ICD-10-CM | POA: Diagnosis not present

## 2018-09-27 DIAGNOSIS — H5213 Myopia, bilateral: Secondary | ICD-10-CM | POA: Diagnosis not present

## 2018-10-19 DIAGNOSIS — M542 Cervicalgia: Secondary | ICD-10-CM | POA: Diagnosis not present

## 2018-10-19 DIAGNOSIS — M791 Myalgia, unspecified site: Secondary | ICD-10-CM | POA: Diagnosis not present

## 2018-10-19 DIAGNOSIS — G43719 Chronic migraine without aura, intractable, without status migrainosus: Secondary | ICD-10-CM | POA: Diagnosis not present

## 2018-11-24 ENCOUNTER — Ambulatory Visit (INDEPENDENT_AMBULATORY_CARE_PROVIDER_SITE_OTHER): Payer: 59 | Admitting: Family Medicine

## 2018-11-24 ENCOUNTER — Other Ambulatory Visit: Payer: Self-pay

## 2018-11-24 ENCOUNTER — Encounter: Payer: Self-pay | Admitting: Family Medicine

## 2018-11-24 VITALS — BP 142/78 | HR 90 | Ht 60.0 in | Wt 150.0 lb

## 2018-11-24 DIAGNOSIS — Z0289 Encounter for other administrative examinations: Secondary | ICD-10-CM

## 2018-11-24 NOTE — Progress Notes (Signed)
Date:  11/24/2018   Name:  Candice Bernard   DOB:  1990/12/20   MRN:  [id number]   Chief Complaint: foster parent form  Patient is a 28 year old female who presents for a comprehensive physical exam. The patient reports the following problems: migraine. Health maintenance has been reviewed up to date.    Review of Systems  Constitutional: Negative.  Negative for chills, fatigue, fever and unexpected weight change.  HENT: Negative for congestion, ear discharge, ear pain, rhinorrhea, sinus pressure, sneezing and sore throat.   Eyes: Negative for photophobia, pain, discharge, redness and itching.  Respiratory: Negative for cough, shortness of breath, wheezing and stridor.   Gastrointestinal: Negative for abdominal pain, blood in stool, constipation, diarrhea, nausea and vomiting.  Endocrine: Negative for cold intolerance, heat intolerance, polydipsia, polyphagia and polyuria.  Genitourinary: Negative for dysuria, flank pain, frequency, hematuria, menstrual problem, pelvic pain, urgency, vaginal bleeding and vaginal discharge.  Musculoskeletal: Negative for arthralgias, back pain and myalgias.  Skin: Negative for rash.  Allergic/Immunologic: Negative for environmental allergies and food allergies.  Neurological: Negative for dizziness, weakness, light-headedness, numbness and headaches.  Hematological: Negative for adenopathy. Does not bruise/bleed easily.  Psychiatric/Behavioral: Negative for dysphoric mood. The patient is not nervous/anxious.     Patient Active Problem List   Diagnosis Date Noted  . Patellofemoral pain syndrome of right knee 09/30/2017  . Plantar fasciitis of left foot 09/30/2017  . Onychocryptosis 04/06/2012  . Pain in toe 04/06/2012  . Allergic rhinitis due to pollen 10/30/2010  . Dysmenorrhea 10/30/2010  . Migraine without aura 10/30/2010  . Tension type headache 10/30/2010    No Known Allergies  Past Surgical History:  Procedure Laterality Date  .  WISDOM TOOTH EXTRACTION      Social History   Tobacco Use  . Smoking status: Never Smoker  . Smokeless tobacco: Never Used  Substance Use Topics  . Alcohol use: Yes    Alcohol/week: 0.0 standard drinks    Comment: rare  . Drug use: No     Medication list has been reviewed and updated.  Current Meds  Medication Sig  . chlorzoxazone (PARAFON) 500 MG tablet Take 500 mg by mouth every 6 (six) hours as needed for muscle spasms. Dr Warden Fillers  . flurbiprofen (ANSAID) 100 MG tablet Take 1 tablet by mouth 2 (two) times daily. Dr Warden Fillers  . fluticasone (FLONASE) 50 MCG/ACT nasal spray Place into the nose.  . levETIRAcetam (KEPPRA) 1000 MG tablet Take 1,000 mg by mouth 2 (two) times daily.  Marland Kitchen levonorgestrel-ethinyl estradiol (JOLESSA) 0.15-0.03 MG tablet Take 1 tablet by mouth daily.  . rizatriptan (MAXALT) 10 MG tablet Take 10 mg by mouth as needed for migraine. May repeat in 2 hours if needed - Dr Warden Fillers    [facility] 2/9 Scores 11/24/2018 06/24/2018 08/17/2017 06/29/2015  PHQ - 2 Score 0 0 0 0  PHQ- 9 Score 0 1 - -    BP Readings from Last 3 Encounters:  11/24/18 (!) 142/78  06/24/18 118/77  09/30/17 104/70    Physical Exam Vitals signs and nursing note reviewed.  Constitutional:      Appearance: She is well-developed.  HENT:     Head: Normocephalic.     Right Ear: Tympanic membrane, ear canal and external ear normal.     Left Ear: Tympanic membrane, ear canal and external ear normal.     Nose: Nose normal.     Mouth/Throat:     Mouth: Mucous membranes  are moist.  Eyes:     General: Lids are everted, no foreign bodies appreciated. No scleral icterus.       Left eye: No foreign body or hordeolum.     Conjunctiva/sclera: Conjunctivae normal.     Right eye: Right conjunctiva is not injected.     Left eye: Left conjunctiva is not injected.     Pupils: Pupils are equal, round, and reactive to light.  Neck:     Musculoskeletal: Normal range of motion and neck supple.      Thyroid: No thyromegaly.     Vascular: No JVD.     Trachea: No tracheal deviation.  Cardiovascular:     Rate and Rhythm: Normal rate and regular rhythm.     Heart sounds: Normal heart sounds. No murmur. No friction rub. No gallop.   Pulmonary:     Effort: Pulmonary effort is normal. No respiratory distress.     Breath sounds: Normal breath sounds. No wheezing, rhonchi or rales.  Abdominal:     General: Bowel sounds are normal.     Palpations: Abdomen is soft. There is no mass.     Tenderness: There is no abdominal tenderness. There is no guarding or rebound.  Musculoskeletal: Normal range of motion.        General: No tenderness.  Lymphadenopathy:     Cervical: No cervical adenopathy.  Skin:    General: Skin is warm.     Findings: No rash.  Neurological:     Mental Status: She is alert and oriented to person, place, and time.     Cranial Nerves: No cranial nerve deficit.     Deep Tendon Reflexes: Reflexes normal.  Psychiatric:        Mood and Affect: Mood is not anxious or depressed.     Wt Readings from Last 3 Encounters:  11/24/18 150 lb (68 kg)  06/24/18 149 lb (67.6 kg)  09/30/17 142 lb (64.4 kg)    BP (!) 142/78   Pulse 90   Ht 5' (1.524 m)   Wt 150 lb (68 kg)   LMP 11/18/2018 (Approximate)   SpO2 100%   BMI 29.29 kg/m   Assessment and Plan:  1. Encounter for physical examination of prospective foster parent Patient presents for an encounter physical exam for foster care.  This is non-involved physical: But patient does not have any contraindications for foster care concerns.  There is no subjective/objective concerns noted on evaluation.  Previous exam was reviewed.  Form was filled out and presented to patient.

## 2019-01-19 DIAGNOSIS — M542 Cervicalgia: Secondary | ICD-10-CM | POA: Diagnosis not present

## 2019-01-19 DIAGNOSIS — G43719 Chronic migraine without aura, intractable, without status migrainosus: Secondary | ICD-10-CM | POA: Diagnosis not present

## 2019-01-19 DIAGNOSIS — M791 Myalgia, unspecified site: Secondary | ICD-10-CM | POA: Diagnosis not present

## 2019-04-19 DIAGNOSIS — M791 Myalgia, unspecified site: Secondary | ICD-10-CM | POA: Diagnosis not present

## 2019-04-19 DIAGNOSIS — G43719 Chronic migraine without aura, intractable, without status migrainosus: Secondary | ICD-10-CM | POA: Diagnosis not present

## 2019-04-19 DIAGNOSIS — M542 Cervicalgia: Secondary | ICD-10-CM | POA: Diagnosis not present

## 2019-07-18 DIAGNOSIS — M791 Myalgia, unspecified site: Secondary | ICD-10-CM | POA: Diagnosis not present

## 2019-07-18 DIAGNOSIS — G43719 Chronic migraine without aura, intractable, without status migrainosus: Secondary | ICD-10-CM | POA: Diagnosis not present

## 2019-07-18 DIAGNOSIS — M542 Cervicalgia: Secondary | ICD-10-CM | POA: Diagnosis not present

## 2019-08-08 ENCOUNTER — Other Ambulatory Visit: Payer: Self-pay | Admitting: Family Medicine

## 2019-08-08 DIAGNOSIS — Z3041 Encounter for surveillance of contraceptive pills: Secondary | ICD-10-CM

## 2019-10-17 DIAGNOSIS — M791 Myalgia, unspecified site: Secondary | ICD-10-CM | POA: Diagnosis not present

## 2019-10-17 DIAGNOSIS — M542 Cervicalgia: Secondary | ICD-10-CM | POA: Diagnosis not present

## 2019-10-17 DIAGNOSIS — G43719 Chronic migraine without aura, intractable, without status migrainosus: Secondary | ICD-10-CM | POA: Diagnosis not present

## 2019-10-30 ENCOUNTER — Other Ambulatory Visit: Payer: Self-pay | Admitting: Family Medicine

## 2019-10-30 DIAGNOSIS — Z3041 Encounter for surveillance of contraceptive pills: Secondary | ICD-10-CM

## 2019-10-31 MED ORDER — LEVONORGEST-ETH ESTRAD 91-DAY 0.15-0.03 MG PO TABS: 1.0000 | ORAL_TABLET | Freq: Every day | ORAL | 0 refills | Status: DC

## 2019-11-23 DIAGNOSIS — L700 Acne vulgaris: Secondary | ICD-10-CM | POA: Diagnosis not present

## 2019-11-25 ENCOUNTER — Encounter: Payer: Self-pay | Admitting: Family Medicine

## 2019-11-25 ENCOUNTER — Ambulatory Visit (INDEPENDENT_AMBULATORY_CARE_PROVIDER_SITE_OTHER): Payer: 59 | Admitting: Family Medicine

## 2019-11-25 ENCOUNTER — Other Ambulatory Visit: Payer: Self-pay

## 2019-11-25 VITALS — BP 128/84 | HR 92 | Ht 60.0 in | Wt 153.0 lb

## 2019-11-25 DIAGNOSIS — Z0000 Encounter for general adult medical examination without abnormal findings: Secondary | ICD-10-CM

## 2019-11-25 NOTE — Progress Notes (Signed)
Date:  11/25/2019   Name:  Candice Bernard   DOB:  04-12-1991   MRN:  [id number]   Chief Complaint: Annual Exam (breast exam /no pap)  Patient is a 29 year old female who presents for a comprehensive physical exam. The patient reports the following problems: none. Health maintenance has been reviewed up to date.   Lab Results  Component Value Date   CREATININE 0.66 08/17/2017   BUN 7 08/17/2017   NA 142 08/17/2017   K 4.5 08/17/2017   CL 103 08/17/2017   CO2 23 08/17/2017   Lab Results  Component Value Date   CHOL 165 08/17/2017   HDL 63 08/17/2017   LDLCALC 83 08/17/2017   TRIG 96 08/17/2017   CHOLHDL 2.6 08/17/2017   No results found for: TSH No results found for: HGBA1C Lab Results  Component Value Date   WBC 5.2 01/25/2016   HGB 12.8 01/25/2016   HCT 38.5 01/25/2016   MCV 85.9 01/25/2016   PLT 250 01/25/2016   Lab Results  Component Value Date   ALT 20 01/25/2016   AST 24 01/25/2016   ALKPHOS 56 01/25/2016   BILITOT 0.5 01/25/2016     Review of Systems  Constitutional: Negative.  Negative for chills, fatigue, fever and unexpected weight change.  HENT: Negative for congestion, ear discharge, ear pain, hearing loss, mouth sores, nosebleeds, rhinorrhea, sinus pressure, sneezing and sore throat.   Eyes: Negative for photophobia, pain, discharge, redness and itching.  Respiratory: Negative for cough, choking, chest tightness, shortness of breath, wheezing and stridor.   Cardiovascular: Negative for chest pain, palpitations and leg swelling.  Gastrointestinal: Negative for abdominal pain, blood in stool, constipation, diarrhea, nausea and vomiting.  Endocrine: Negative for cold intolerance, heat intolerance, polydipsia, polyphagia and polyuria.  Genitourinary: Negative for dysuria, flank pain, frequency, hematuria, menstrual problem, pelvic pain, urgency, vaginal bleeding and vaginal discharge.  Musculoskeletal: Negative for arthralgias, back pain and  myalgias.  Skin: Negative for rash.  Allergic/Immunologic: Negative for environmental allergies and food allergies.  Neurological: Negative for dizziness, weakness, light-headedness, numbness and headaches.  Hematological: Negative for adenopathy. Does not bruise/bleed easily.  Psychiatric/Behavioral: Negative for dysphoric mood. The patient is not nervous/anxious.     Patient Active Problem List   Diagnosis Date Noted  . Patellofemoral pain syndrome of right knee 09/30/2017  . Plantar fasciitis of left foot 09/30/2017  . Onychocryptosis 04/06/2012  . Pain in toe 04/06/2012  . Allergic rhinitis due to pollen 10/30/2010  . Dysmenorrhea 10/30/2010  . Migraine without aura 10/30/2010  . Tension type headache 10/30/2010    No Known Allergies  Past Surgical History:  Procedure Laterality Date  . WISDOM TOOTH EXTRACTION      Social History   Tobacco Use  . Smoking status: Never Smoker  . Smokeless tobacco: Never Used  Substance Use Topics  . Alcohol use: Yes    Alcohol/week: 0.0 standard drinks    Comment: rare  . Drug use: No     Medication list has been reviewed and updated.  Current Meds  Medication Sig  . chlorzoxazone (PARAFON) 500 MG tablet Take 500 mg by mouth every 6 (six) hours as needed for muscle spasms. Dr Warden Fillers  . Dapsone (ACZONE EX) Apply topically in the morning.  . flurbiprofen (ANSAID) 100 MG tablet Take 1 tablet by mouth 2 (two) times daily. Dr Warden Fillers  . fluticasone (FLONASE) 50 MCG/ACT nasal spray Place into the nose.  . levETIRAcetam (KEPPRA) 1000 MG  tablet Take 1,000 mg by mouth 2 (two) times daily.  Marland Kitchen levonorgestrel-ethinyl estradiol (SEASONALE) 0.15-0.03 MG tablet Take 1 tablet by mouth daily.  . rizatriptan (MAXALT) 10 MG tablet Take 10 mg by mouth as needed for migraine. May repeat in 2 hours if needed - Dr Warden Fillers  . Sarecycline HCl (SEYSARA) 100 MG TABS Take 1 tablet by mouth daily.  Marland Kitchen spironolactone (ALDACTONE) 100 MG  tablet Take 100 mg by mouth daily.  . Trifarotene (AKLIEF EX) Apply topically at bedtime.    PHQ 2/9 Scores 11/25/2019 11/24/2018 06/24/2018 08/17/2017  PHQ - 2 Score 0 0 0 0  PHQ- 9 Score 1 0 1 -    GAD 7 : Generalized Anxiety Score 11/25/2019  Nervous, Anxious, on Edge 0  Control/stop worrying 0  Worry too much - different things 0  Trouble relaxing 1  Restless 0  Easily annoyed or irritable 0  Afraid - awful might happen 0  Total GAD 7 Score 1  Anxiety Difficulty Not difficult at all    BP Readings from Last 3 Encounters:  11/25/19 128/84  11/24/18 (!) 142/78  06/24/18 118/77    Physical Exam Vitals and nursing note reviewed.  Constitutional:      Appearance: She is well-developed, well-groomed and normal weight.  HENT:     Head: Normocephalic.     Right Ear: Hearing, tympanic membrane, ear canal and external ear normal. There is no impacted cerumen.     Left Ear: Hearing, tympanic membrane, ear canal and external ear normal. There is no impacted cerumen.     Nose: Nose normal.     Mouth/Throat:     Lips: Pink.     Mouth: Mucous membranes are moist.     Pharynx: Oropharynx is clear. Uvula midline.  Eyes:     General: Lids are normal. Lids are everted, no foreign bodies appreciated. Vision grossly intact. Gaze aligned appropriately. No scleral icterus.       Left eye: No foreign body or hordeolum.     Extraocular Movements: Extraocular movements intact.     Conjunctiva/sclera: Conjunctivae normal.     Right eye: Right conjunctiva is not injected.     Left eye: Left conjunctiva is not injected.     Pupils: Pupils are equal, round, and reactive to light.     Funduscopic exam:       Left eye: Red reflex present. Neck:     Thyroid: No thyroid mass, thyromegaly or thyroid tenderness.     Vascular: Normal carotid pulses. No carotid bruit, hepatojugular reflux or JVD.     Trachea: Trachea and phonation normal. No tracheal deviation.  Cardiovascular:     Rate and Rhythm:  Normal rate and regular rhythm.     Pulses: Normal pulses.          Carotid pulses are 2+ on the right side and 2+ on the left side.      Radial pulses are 2+ on the right side and 2+ on the left side.       Femoral pulses are 2+ on the right side and 2+ on the left side.      Popliteal pulses are 2+ on the right side and 2+ on the left side.       Dorsalis pedis pulses are 2+ on the right side and 2+ on the left side.       Posterior tibial pulses are 2+ on the right side and 2+ on the left side.  Heart sounds: Normal heart sounds, S1 normal and S2 normal. No murmur heard.  No systolic murmur is present.  No diastolic murmur is present.  No friction rub. No gallop. No S3 or S4 sounds.   Pulmonary:     Effort: Pulmonary effort is normal. No respiratory distress.     Breath sounds: Normal breath sounds. No decreased breath sounds, wheezing, rhonchi or rales.  Chest:     Breasts:        Right: Normal. No swelling, bleeding, inverted nipple, mass, nipple discharge, skin change or tenderness.        Left: Normal. No bleeding, inverted nipple, mass, nipple discharge, skin change or tenderness.  Abdominal:     General: Bowel sounds are normal.     Palpations: Abdomen is soft. There is no hepatomegaly, splenomegaly or mass.     Tenderness: There is no abdominal tenderness. There is no guarding or rebound.     Hernia: There is no hernia in the umbilical area or ventral area.  Musculoskeletal:        General: No tenderness. Normal range of motion.     Cervical back: Normal range of motion and neck supple.     Right lower leg: No edema.     Left lower leg: No edema.  Lymphadenopathy:     Head:     Right side of head: No submental or submandibular adenopathy.     Left side of head: No submental or submandibular adenopathy.     Cervical: No cervical adenopathy.     Right cervical: No superficial, deep or posterior cervical adenopathy.    Left cervical: No superficial, deep or posterior  cervical adenopathy.     Upper Body:     Right upper body: No supraclavicular or axillary adenopathy.     Left upper body: No supraclavicular or axillary adenopathy.  Skin:    General: Skin is warm.     Capillary Refill: Capillary refill takes less than 2 seconds.     Findings: No rash.  Neurological:     Mental Status: She is alert and oriented to person, place, and time.     Cranial Nerves: Cranial nerves are intact. No cranial nerve deficit.     Sensory: Sensation is intact.     Motor: Motor function is intact.     Deep Tendon Reflexes: Reflexes normal.  Psychiatric:        Mood and Affect: Mood is not anxious or depressed.        Behavior: Behavior is cooperative.     Wt Readings from Last 3 Encounters:  11/25/19 153 lb (69.4 kg)  11/24/18 150 lb (68 kg)  06/24/18 149 lb (67.6 kg)    BP 128/84   Pulse 92   Ht 5' (1.524 m)   Wt 153 lb (69.4 kg)   LMP 11/03/2019 Comment: lasted 2 weeks on last period   SpO2 99%   BMI 29.88 kg/m   Assessment and Plan:  1. Annual physical exam No subjective/objective concerns noted during history and physical exam.  Patient's chart was reviewed for previous encounters, most recent labs, most recent imaging, and care everywhere.SHARLETT LIENEMANN is a 29 y.o. female who presents today for her Complete Annual Exam. She feels well. She reports exercising . She reports she is sleeping well. Immunizations are reviewed and recommendations provided.   Age appropriate screening tests are discussed. Counseling given for risk factor reduction interventions.  Labs were obtained including lipid panel, renal function panel, and  CBC. - Lipid Panel With LDL/HDL Ratio - Renal Function Panel - CBC with Differential/Platelet

## 2019-11-26 LAB — CBC WITH DIFFERENTIAL/PLATELET
Basophils Absolute: 0 10*3/uL (ref 0.0–0.2)
Basos: 0 %
EOS (ABSOLUTE): 0.1 10*3/uL (ref 0.0–0.4)
Eos: 1 %
Hematocrit: 35.1 % (ref 34.0–46.6)
Hemoglobin: 11.5 g/dL (ref 11.1–15.9)
Immature Grans (Abs): 0 10*3/uL (ref 0.0–0.1)
Immature Granulocytes: 0 %
Lymphocytes Absolute: 2.1 10*3/uL (ref 0.7–3.1)
Lymphs: 32 %
MCH: 26.5 pg — ABNORMAL LOW (ref 26.6–33.0)
MCHC: 32.8 g/dL (ref 31.5–35.7)
MCV: 81 fL (ref 79–97)
Monocytes Absolute: 0.5 10*3/uL (ref 0.1–0.9)
Monocytes: 7 %
Neutrophils Absolute: 4.1 10*3/uL (ref 1.4–7.0)
Neutrophils: 60 %
Platelets: 311 10*3/uL (ref 150–450)
RBC: 4.34 x10E6/uL (ref 3.77–5.28)
RDW: 13.2 % (ref 11.7–15.4)
WBC: 6.8 10*3/uL (ref 3.4–10.8)

## 2019-11-26 LAB — LIPID PANEL WITH LDL/HDL RATIO
Cholesterol, Total: 160 mg/dL (ref 100–199)
HDL: 55 mg/dL (ref >39)
LDL Chol Calc (NIH): 87 mg/dL (ref 0–99)
LDL/HDL Ratio: 1.6 ratio (ref 0.0–3.2)
Triglycerides: 95 mg/dL (ref 0–149)
VLDL Cholesterol Cal: 18 mg/dL (ref 5–40)

## 2019-11-26 LAB — RENAL FUNCTION PANEL
Albumin: 4.3 g/dL (ref 3.9–5.0)
BUN/Creatinine Ratio: 12 (ref 9–23)
BUN: 9 mg/dL (ref 6–20)
CO2: 21 mmol/L (ref 20–29)
Calcium: 9.4 mg/dL (ref 8.7–10.2)
Chloride: 104 mmol/L (ref 96–106)
Creatinine, Ser: 0.74 mg/dL (ref 0.57–1.00)
GFR calc Af Amer: 128 mL/min/{1.73_m2} (ref >59)
GFR calc non Af Amer: 111 mL/min/{1.73_m2} (ref >59)
Glucose: 91 mg/dL (ref 65–99)
Phosphorus: 3.2 mg/dL (ref 3.0–4.3)
Potassium: 4.7 mmol/L (ref 3.5–5.2)
Sodium: 138 mmol/L (ref 134–144)

## 2020-01-04 DIAGNOSIS — L700 Acne vulgaris: Secondary | ICD-10-CM | POA: Diagnosis not present

## 2020-01-18 ENCOUNTER — Other Ambulatory Visit: Payer: Self-pay | Admitting: Specialist

## 2020-01-18 DIAGNOSIS — M542 Cervicalgia: Secondary | ICD-10-CM | POA: Diagnosis not present

## 2020-01-18 DIAGNOSIS — M791 Myalgia, unspecified site: Secondary | ICD-10-CM | POA: Diagnosis not present

## 2020-01-18 DIAGNOSIS — G43719 Chronic migraine without aura, intractable, without status migrainosus: Secondary | ICD-10-CM | POA: Diagnosis not present

## 2020-01-25 DIAGNOSIS — Z23 Encounter for immunization: Secondary | ICD-10-CM | POA: Diagnosis not present

## 2020-01-30 ENCOUNTER — Other Ambulatory Visit: Payer: Self-pay | Admitting: Family Medicine

## 2020-01-30 DIAGNOSIS — Z3041 Encounter for surveillance of contraceptive pills: Secondary | ICD-10-CM

## 2020-01-30 NOTE — Telephone Encounter (Signed)
Approved per protocol.  

## 2020-04-18 ENCOUNTER — Other Ambulatory Visit: Payer: Self-pay | Admitting: Specialist

## 2020-04-18 DIAGNOSIS — M542 Cervicalgia: Secondary | ICD-10-CM | POA: Diagnosis not present

## 2020-04-18 DIAGNOSIS — G43719 Chronic migraine without aura, intractable, without status migrainosus: Secondary | ICD-10-CM | POA: Diagnosis not present

## 2020-04-18 DIAGNOSIS — M791 Myalgia, unspecified site: Secondary | ICD-10-CM | POA: Diagnosis not present

## 2020-05-07 ENCOUNTER — Other Ambulatory Visit: Payer: Self-pay | Admitting: Family Medicine

## 2020-05-07 DIAGNOSIS — Z3041 Encounter for surveillance of contraceptive pills: Secondary | ICD-10-CM

## 2020-07-18 ENCOUNTER — Other Ambulatory Visit: Payer: Self-pay | Admitting: Specialist

## 2020-07-18 DIAGNOSIS — M791 Myalgia, unspecified site: Secondary | ICD-10-CM | POA: Diagnosis not present

## 2020-07-18 DIAGNOSIS — M542 Cervicalgia: Secondary | ICD-10-CM | POA: Diagnosis not present

## 2020-07-18 DIAGNOSIS — G43719 Chronic migraine without aura, intractable, without status migrainosus: Secondary | ICD-10-CM | POA: Diagnosis not present

## 2020-08-06 ENCOUNTER — Other Ambulatory Visit: Payer: Self-pay

## 2020-08-06 ENCOUNTER — Other Ambulatory Visit: Payer: Self-pay | Admitting: Family Medicine

## 2020-08-06 DIAGNOSIS — Z3041 Encounter for surveillance of contraceptive pills: Secondary | ICD-10-CM

## 2020-08-06 NOTE — Telephone Encounter (Signed)
Requested medications are due for refill today yes  Requested medications are on the active medication list yes  Last refill 1/17  Last visit 11/25/19  Future visit scheduled no  Notes to clinic Has already had a curtesy refill and there is no upcoming appointment scheduled.

## 2020-08-07 ENCOUNTER — Other Ambulatory Visit: Payer: Self-pay

## 2020-08-07 ENCOUNTER — Other Ambulatory Visit: Payer: Self-pay | Admitting: Family Medicine

## 2020-08-07 DIAGNOSIS — Z3041 Encounter for surveillance of contraceptive pills: Secondary | ICD-10-CM

## 2020-08-07 MED ORDER — LEVONORGEST-ETH ESTRAD 91-DAY 0.15-0.03 MG PO TABS
1.0000 | ORAL_TABLET | Freq: Every day | ORAL | 3 refills | Status: DC
  Filled 2020-08-07: qty 91, 91d supply, fill #0
  Filled 2020-11-04: qty 91, 91d supply, fill #1
  Filled 2021-01-30: qty 91, 91d supply, fill #2
  Filled 2021-05-05: qty 91, 91d supply, fill #3

## 2020-08-07 MED FILL — Levetiracetam Tab 1000 MG: ORAL | 30 days supply | Qty: 60 | Fill #0 | Status: AC

## 2020-08-07 NOTE — Telephone Encounter (Signed)
Requested medication (s) are due for refill today: yes   Requested medication (s) are on the active medication list: yes   Last refill:  05/07/2020  Future visit scheduled:no  Notes to clinic:  last refill states that patient must keep appointment    Requested Prescriptions  Pending Prescriptions Disp Refills   levonorgestrel-ethinyl estradiol (SEASONALE) 0.15-0.03 MG tablet 91 tablet 0    Sig: TAKE 1 TABLET BY MOUTH ONCE DAILY **MUST KEEP APPOINTMENT**      OB/GYN:  Contraceptives Passed - 08/07/2020  8:24 AM      Passed - Last BP in normal range    BP Readings from Last 1 Encounters:  11/25/19 128/84          Passed - Valid encounter within last 12 months    Recent Outpatient Visits           8 months ago Annual physical exam   [facility] Juline Patch, MD   1 year ago Encounter for physical examination of prospective foster parent   [facility] Juline Patch, MD   2 years ago Encounter for surveillance of contraceptive pills   [facility] Juline Patch, MD   2 years ago Annual physical exam   [facility] Juline Patch, MD   4 years ago Annual physical exam   [facility] Juline Patch, MD

## 2020-08-07 NOTE — Telephone Encounter (Signed)
Left voice mail to set up appointment for medicine refills 

## 2020-08-09 DIAGNOSIS — S86892A Other injury of other muscle(s) and tendon(s) at lower leg level, left leg, initial encounter: Secondary | ICD-10-CM | POA: Diagnosis not present

## 2020-09-02 MED FILL — Levetiracetam Tab 1000 MG: ORAL | 30 days supply | Qty: 60 | Fill #1 | Status: AC

## 2020-09-03 ENCOUNTER — Other Ambulatory Visit: Payer: Self-pay

## 2020-10-01 MED FILL — Levetiracetam Tab 1000 MG: ORAL | 30 days supply | Qty: 60 | Fill #2 | Status: AC

## 2020-10-02 ENCOUNTER — Other Ambulatory Visit: Payer: Self-pay

## 2020-10-16 ENCOUNTER — Other Ambulatory Visit: Payer: Self-pay

## 2020-10-16 DIAGNOSIS — M542 Cervicalgia: Secondary | ICD-10-CM | POA: Diagnosis not present

## 2020-10-16 DIAGNOSIS — G43719 Chronic migraine without aura, intractable, without status migrainosus: Secondary | ICD-10-CM | POA: Diagnosis not present

## 2020-10-16 DIAGNOSIS — M791 Myalgia, unspecified site: Secondary | ICD-10-CM | POA: Diagnosis not present

## 2020-10-16 MED ORDER — LEVETIRACETAM 1000 MG PO TABS
ORAL_TABLET | ORAL | 2 refills | Status: DC
  Filled 2020-10-16 – 2020-11-04 (×2): qty 60, 30d supply, fill #0
  Filled 2020-12-03: qty 60, 30d supply, fill #1
  Filled 2021-01-01: qty 60, 30d supply, fill #2

## 2020-10-16 MED ORDER — CHLORZOXAZONE 500 MG PO TABS
ORAL_TABLET | ORAL | 1 refills | Status: DC
  Filled 2020-10-16: qty 30, 30d supply, fill #0

## 2020-10-16 MED ORDER — RIZATRIPTAN BENZOATE 10 MG PO TBDP
ORAL_TABLET | ORAL | 2 refills | Status: DC
  Filled 2020-10-16: qty 6, 15d supply, fill #0

## 2020-10-29 ENCOUNTER — Other Ambulatory Visit: Payer: Self-pay

## 2020-11-05 ENCOUNTER — Other Ambulatory Visit: Payer: Self-pay

## 2020-12-04 ENCOUNTER — Other Ambulatory Visit: Payer: Self-pay

## 2021-01-01 ENCOUNTER — Other Ambulatory Visit: Payer: Self-pay

## 2021-01-15 ENCOUNTER — Other Ambulatory Visit: Payer: Self-pay

## 2021-01-15 DIAGNOSIS — G43719 Chronic migraine without aura, intractable, without status migrainosus: Secondary | ICD-10-CM | POA: Diagnosis not present

## 2021-01-15 DIAGNOSIS — M791 Myalgia, unspecified site: Secondary | ICD-10-CM | POA: Diagnosis not present

## 2021-01-15 DIAGNOSIS — M542 Cervicalgia: Secondary | ICD-10-CM | POA: Diagnosis not present

## 2021-01-15 MED ORDER — CHLORZOXAZONE 500 MG PO TABS
ORAL_TABLET | ORAL | 1 refills | Status: DC
  Filled 2021-01-15: qty 30, 30d supply, fill #0

## 2021-01-15 MED ORDER — RIZATRIPTAN BENZOATE 10 MG PO TBDP
ORAL_TABLET | ORAL | 2 refills | Status: DC
  Filled 2021-01-15: qty 6, 10d supply, fill #0

## 2021-01-15 MED ORDER — LEVETIRACETAM 1000 MG PO TABS
ORAL_TABLET | ORAL | 2 refills | Status: DC
  Filled 2021-01-15 – 2021-01-30 (×2): qty 60, 30d supply, fill #0
  Filled 2021-03-04: qty 60, 30d supply, fill #1
  Filled 2021-04-02: qty 60, 30d supply, fill #2

## 2021-01-25 ENCOUNTER — Other Ambulatory Visit: Payer: Self-pay

## 2021-01-31 ENCOUNTER — Other Ambulatory Visit: Payer: Self-pay

## 2021-02-13 ENCOUNTER — Ambulatory Visit
Admission: EM | Admit: 2021-02-13 | Discharge: 2021-02-13 | Disposition: A | Discharge status: Home / Self Care | Payer: 59 | Attending: Emergency Medicine | Admitting: Emergency Medicine

## 2021-02-13 ENCOUNTER — Ambulatory Visit: Payer: 59

## 2021-02-13 ENCOUNTER — Ambulatory Visit (INDEPENDENT_AMBULATORY_CARE_PROVIDER_SITE_OTHER): Payer: 59

## 2021-02-13 ENCOUNTER — Other Ambulatory Visit: Payer: Self-pay

## 2021-02-13 DIAGNOSIS — M7989 Other specified soft tissue disorders: Secondary | ICD-10-CM | POA: Diagnosis not present

## 2021-02-13 DIAGNOSIS — S93401A Sprain of unspecified ligament of right ankle, initial encounter: Secondary | ICD-10-CM | POA: Diagnosis not present

## 2021-02-13 DIAGNOSIS — M25571 Pain in right ankle and joints of right foot: Secondary | ICD-10-CM | POA: Diagnosis not present

## 2021-02-13 NOTE — ED Provider Notes (Signed)
[facility]    CSN: [id number] Arrival date & time: 02/13/21  1628      History   Chief Complaint Chief Complaint  Patient presents with   Ankle Injury    HPI Candice Bernard is a 30 y.o. female.   HPI  Ankle Injury: Patient reports that about 1 to 2 hours ago she was running on a trail when all of a sudden her right ankle inverted.  She thinks that she hit a rock during this time that caused her ankle to invert.  She experienced immediate severe sharp pain of her lateral ankle followed by a crack sound that she reports sounded "bony" in nature.  She states that she has sprained her ankles a few times but never has had a similar pain or crack.  She states that she was not able to get up or walk for a long time afterward due to the pain.  She states that she is able to hobble on the leg but is not able to ambulate properly or put much weight on the leg.  She has taken ibuprofen which has helped with the pain to where it is bearable.  She denies any significant skin color changes, numbness and tingling or skin breakdown.  Past Medical History:  Diagnosis Date   Migraine     Patient Active Problem List   Diagnosis Date Noted   Patellofemoral pain syndrome of right knee 09/30/2017   Plantar fasciitis of left foot 09/30/2017   Onychocryptosis 04/06/2012   Pain in toe 04/06/2012   Allergic rhinitis due to pollen 10/30/2010   Dysmenorrhea 10/30/2010   Migraine without aura 10/30/2010   Tension type headache 10/30/2010    Past Surgical History:  Procedure Laterality Date   WISDOM TOOTH EXTRACTION      OB History   No obstetric history on file.      Home Medications    Prior to Admission medications   Medication Sig Start Date End Date Taking? Authorizing Provider  chlorzoxazone (PARAFON) 500 MG tablet Take 500 mg by mouth every 6 (six) hours as needed for muscle spasms. Dr Warden Fillers    [provider]  chlorzoxazone (PARAFON) 500 MG  tablet TAKE 1 TABLET BY MOUTH AS NEEDED FOR HEADACHE. 07/18/20 07/18/21  Orie Rout, MD  chlorzoxazone (PARAFON) 500 MG tablet TAKE 1 TABLET BY MOUTH AS NEEDED FOR HEADACHE 04/18/20 04/18/21  Orie Rout, MD  chlorzoxazone (PARAFON) 500 MG tablet 1 TAB PRN headache. 10/16/20     chlorzoxazone (PARAFON) 500 MG tablet 1 TAB PRN headache. 01/15/21     Dapsone (ACZONE EX) Apply topically in the morning.    [provider]  flurbiprofen (ANSAID) 100 MG tablet Take 1 tablet by mouth 2 (two) times daily. Dr Warden Fillers 12/25/15   [provider]  fluticasone (FLONASE) 50 MCG/ACT nasal spray Place into the nose. 02/14/15   [provider]  levETIRAcetam (KEPPRA) 1000 MG tablet Take 1,000 mg by mouth 2 (two) times daily.    [provider]  levETIRAcetam (KEPPRA) 1000 MG tablet TAKE 1 TABLET BY MOUTH 2 TIMES A DAY 04/18/20 04/18/21  Orie Rout, MD  levETIRAcetam (KEPPRA) 1000 MG tablet TAKE 1 TABLET BY MOUTH TWICE DAILY 01/18/20 01/17/21  Orie Rout, MD  levETIRAcetam (KEPPRA) 1000 MG tablet take 1 tablet by oral route 2 times a day for 30 days 01/15/21     levonorgestrel-ethinyl estradiol (SEASONALE) 0.15-0.03 MG tablet Take 1 tablet by mouth daily. 08/07/20 05/07/21  Juline Patch, MD  rizatriptan (MAXALT) 10 MG tablet Take 10 mg by mouth as needed for migraine. May repeat in 2 hours if needed - Dr Warden Fillers    [provider]  rizatriptan (MAXALT-MLT) 10 MG disintegrating tablet TAKE 1 TABLET BY MOUTH AS NEEDED FOR MIGRAINE; MAY REPEAT ONCE AFTER 2 HOURS. 07/18/20 07/18/21  Orie Rout, MD  rizatriptan (MAXALT-MLT) 10 MG disintegrating tablet TAKE 1 TABLET BY MOUTH AS NEEDED FOR MIGRAINE; MAY REPEAT ONCE AFTER 2 HOURS. 04/18/20 04/18/21  Orie Rout, MD  rizatriptan (MAXALT-MLT) 10 MG disintegrating tablet 1 TAB PRN migraine; may repeat once after 2 hours. 10/16/20     rizatriptan (MAXALT-MLT) 10 MG disintegrating tablet 1 TAB  PRN migraine; may repeat once after 2 hours 01/15/21     Sarecycline HCl (SEYSARA) 100 MG TABS Take 1 tablet by mouth daily.    [provider]  spironolactone (ALDACTONE) 100 MG tablet Take 100 mg by mouth daily. 11/23/19   [provider]  Trifarotene (AKLIEF EX) Apply topically at bedtime.    [provider]    Family History Family History  Problem Relation Age of Onset   Colitis Father     Social History Social History   Tobacco Use   Smoking status: Never   Smokeless tobacco: Never  Substance Use Topics   Alcohol use: Yes    Alcohol/week: 0.0 standard drinks    Comment: rare   Drug use: No     Allergies   Patient has no known allergies.   Review of Systems Review of Systems  As stated above in HPI Physical Exam Triage Vital Signs ED Triage Vitals  Enc Vitals Group     BP 02/13/21 1717 133/87     Pulse Rate 02/13/21 1717 90     Resp 02/13/21 1717 18     Temp 02/13/21 1717 98.6 F (37 C)     Temp Source 02/13/21 1717 Oral     SpO2 02/13/21 1717 100 %     Weight 02/13/21 1719 165 lb (74.8 kg)     Height 02/13/21 1719 5' (1.524 m)     Head Circumference --      Peak Flow --      Pain Score 02/13/21 1718 7     Pain Loc --      Pain Edu? --      Excl. in [place]? --    No data found.  Updated Vital Signs BP 133/87   Pulse 90   Temp 98.6 F (37 C) (Oral)   Resp 18   Ht 5' (1.524 m)   Wt 165 lb (74.8 kg)   SpO2 100%   BMI 32.22 kg/m   Physical Exam Vitals and nursing note reviewed.  Constitutional:      General: She is not in acute distress.    Appearance: Normal appearance. She is not ill-appearing, toxic-appearing or diaphoretic.  Cardiovascular:     Pulses: Normal pulses.  Musculoskeletal:     Right ankle: Swelling and ecchymosis present. Tenderness present over the lateral malleolus. No posterior TF ligament tenderness. Decreased range of motion. Anterior drawer test negative. Normal pulse.     Right Achilles Tendon:  Normal. No tenderness or defects. Thompson's test negative.     Right foot: Normal.     Left foot: Normal.       Legs:     Comments: Right ankle flexion decreased by about 50%. Side to side movement decreased by about 25%.  Skin:    General: Skin is warm.  Neurological:     Mental Status: She is alert.     UC Treatments / Results  Labs (all labs ordered are listed, but only abnormal results are displayed) Labs Reviewed - No data to display  EKG   Radiology DG Ankle Complete Right  Result Date: 02/13/2021 CLINICAL DATA:  Pain and swelling RIGHT ankle, injury while running EXAM: RIGHT ANKLE - COMPLETE 3+ VIEW COMPARISON:  None FINDINGS: Osseous mineralization normal. Joint spaces preserved. Tiny plantar calcaneal spur. No acute fracture, dislocation, or bone destruction. IMPRESSION: No acute osseous abnormalities. Electronically Signed   By: Lavonia Dana M.D.   On: 02/13/2021 18:06    Procedures Procedures (including critical care time)  Medications Ordered in UC Medications - No data to display  Initial Impression / Assessment and Plan / UC Course  I have reviewed the triage vital signs and the nursing notes.  Pertinent labs & imaging results that were available during my care of the patient were reviewed by me and considered in my medical decision making (see chart for details).     New.  X-ray pending to ensure no sign of significant ligament damage or fracture.  UPDATE: Patient's x-ray is negative for abnormality.  She states that she has crutches and items at home to help with ambulation so she defers these today in office.  I am going to send her in naproxen to use as needed with food and water for symptoms.  Should symptoms fail to improve or worsen over the next few days I would recommend orthopedic follow-up.  For now I have recommended nonweightbearing status for the next 2 weeks. Final Clinical Impressions(s) / UC Diagnoses   Final diagnoses:  Sprain of right  ankle, unspecified ligament, initial encounter   Discharge Instructions   None    ED Prescriptions   None    PDMP not reviewed this encounter.   Hughie Closs, [place] 02/13/21 1832

## 2021-02-13 NOTE — ED Triage Notes (Signed)
Pt sts she was trail running and thinks she stepped on a rock. Sts she heard a pop and then went down. Pain and swelling to R ankle.

## 2021-03-05 ENCOUNTER — Other Ambulatory Visit: Payer: Self-pay

## 2021-04-03 ENCOUNTER — Other Ambulatory Visit: Payer: Self-pay

## 2021-04-08 ENCOUNTER — Other Ambulatory Visit: Payer: Self-pay

## 2021-04-08 DIAGNOSIS — M542 Cervicalgia: Secondary | ICD-10-CM | POA: Diagnosis not present

## 2021-04-08 DIAGNOSIS — G43719 Chronic migraine without aura, intractable, without status migrainosus: Secondary | ICD-10-CM | POA: Diagnosis not present

## 2021-04-08 DIAGNOSIS — M791 Myalgia, unspecified site: Secondary | ICD-10-CM | POA: Diagnosis not present

## 2021-04-08 MED ORDER — LEVETIRACETAM 1000 MG PO TABS
ORAL_TABLET | ORAL | 2 refills | Status: AC
  Filled 2021-04-08 – 2021-05-05 (×2): qty 60, 30d supply, fill #0
  Filled 2021-06-02: qty 60, 30d supply, fill #1

## 2021-04-08 MED ORDER — CHLORZOXAZONE 500 MG PO TABS
ORAL_TABLET | ORAL | 1 refills | Status: AC
  Filled 2021-04-08 – 2021-04-24 (×2): qty 30, 30d supply, fill #0
  Filled 2021-06-02: qty 30, 30d supply, fill #1

## 2021-04-08 MED ORDER — RIZATRIPTAN BENZOATE 10 MG PO TBDP
ORAL_TABLET | ORAL | 2 refills | Status: AC
  Filled 2021-04-08: qty 6, 18d supply, fill #0

## 2021-04-22 ENCOUNTER — Other Ambulatory Visit: Payer: Self-pay

## 2021-04-24 ENCOUNTER — Other Ambulatory Visit: Payer: Self-pay

## 2021-04-25 ENCOUNTER — Other Ambulatory Visit: Payer: Self-pay

## 2021-04-25 DIAGNOSIS — M791 Myalgia, unspecified site: Secondary | ICD-10-CM | POA: Diagnosis not present

## 2021-04-25 DIAGNOSIS — G43719 Chronic migraine without aura, intractable, without status migrainosus: Secondary | ICD-10-CM | POA: Diagnosis not present

## 2021-04-25 DIAGNOSIS — M542 Cervicalgia: Secondary | ICD-10-CM | POA: Diagnosis not present

## 2021-05-01 ENCOUNTER — Other Ambulatory Visit: Payer: Self-pay

## 2021-05-01 MED ORDER — EMGALITY 120 MG/ML ~~LOC~~ SOAJ
SUBCUTANEOUS | 0 refills | Status: DC
  Filled 2021-05-01 – 2021-05-08 (×7): qty 2, 30d supply, fill #0

## 2021-05-02 ENCOUNTER — Other Ambulatory Visit: Payer: Self-pay

## 2021-05-06 ENCOUNTER — Other Ambulatory Visit: Payer: Self-pay

## 2021-05-08 ENCOUNTER — Other Ambulatory Visit: Payer: Self-pay

## 2021-05-09 ENCOUNTER — Other Ambulatory Visit: Payer: Self-pay

## 2021-05-10 ENCOUNTER — Other Ambulatory Visit: Payer: Self-pay

## 2021-05-15 ENCOUNTER — Other Ambulatory Visit: Payer: Self-pay

## 2021-05-15 DIAGNOSIS — G43719 Chronic migraine without aura, intractable, without status migrainosus: Secondary | ICD-10-CM | POA: Diagnosis not present

## 2021-05-15 MED ORDER — EMGALITY 120 MG/ML ~~LOC~~ SOAJ
SUBCUTANEOUS | 2 refills | Status: DC
  Filled 2021-05-15 – 2021-06-04 (×2): qty 1, 28d supply, fill #0
  Filled 2021-06-05: qty 1, 30d supply, fill #0

## 2021-06-03 ENCOUNTER — Other Ambulatory Visit: Payer: Self-pay

## 2021-06-04 ENCOUNTER — Other Ambulatory Visit: Payer: Self-pay

## 2021-06-05 ENCOUNTER — Other Ambulatory Visit: Payer: Self-pay

## 2021-06-17 DIAGNOSIS — M79671 Pain in right foot: Secondary | ICD-10-CM | POA: Diagnosis not present

## 2021-06-17 DIAGNOSIS — M25371 Other instability, right ankle: Secondary | ICD-10-CM | POA: Diagnosis not present

## 2021-06-17 DIAGNOSIS — M25571 Pain in right ankle and joints of right foot: Secondary | ICD-10-CM | POA: Diagnosis not present

## 2021-06-25 ENCOUNTER — Other Ambulatory Visit: Payer: Self-pay

## 2021-06-25 DIAGNOSIS — M791 Myalgia, unspecified site: Secondary | ICD-10-CM | POA: Diagnosis not present

## 2021-06-25 DIAGNOSIS — G43719 Chronic migraine without aura, intractable, without status migrainosus: Secondary | ICD-10-CM | POA: Diagnosis not present

## 2021-06-25 DIAGNOSIS — M542 Cervicalgia: Secondary | ICD-10-CM | POA: Diagnosis not present

## 2021-06-25 MED ORDER — EMGALITY 120 MG/ML ~~LOC~~ SOAJ
SUBCUTANEOUS | 2 refills | Status: DC
  Filled 2021-06-25: qty 1, 30d supply, fill #0
  Filled 2021-07-15: qty 1, 30d supply, fill #1
  Filled 2021-09-08: qty 1, 30d supply, fill #2

## 2021-06-25 MED ORDER — LEVETIRACETAM 1000 MG PO TABS
ORAL_TABLET | ORAL | 2 refills | Status: AC
  Filled 2021-06-25: qty 60, 30d supply, fill #0
  Filled 2021-07-31: qty 60, 30d supply, fill #1
  Filled 2021-08-28: qty 60, 30d supply, fill #2

## 2021-06-25 MED ORDER — RIZATRIPTAN BENZOATE 10 MG PO TBDP
ORAL_TABLET | ORAL | 2 refills | Status: AC
  Filled 2021-06-25: qty 6, 30d supply, fill #0

## 2021-06-27 DIAGNOSIS — M25571 Pain in right ankle and joints of right foot: Secondary | ICD-10-CM | POA: Diagnosis not present

## 2021-07-01 ENCOUNTER — Other Ambulatory Visit: Payer: Self-pay

## 2021-07-03 ENCOUNTER — Other Ambulatory Visit: Payer: Self-pay

## 2021-07-10 DIAGNOSIS — M25571 Pain in right ankle and joints of right foot: Secondary | ICD-10-CM | POA: Diagnosis not present

## 2021-07-15 ENCOUNTER — Other Ambulatory Visit: Payer: Self-pay

## 2021-07-24 DIAGNOSIS — M25571 Pain in right ankle and joints of right foot: Secondary | ICD-10-CM | POA: Diagnosis not present

## 2021-07-25 ENCOUNTER — Other Ambulatory Visit: Payer: Self-pay

## 2021-07-31 ENCOUNTER — Other Ambulatory Visit: Payer: Self-pay | Admitting: Family Medicine

## 2021-07-31 DIAGNOSIS — Z3041 Encounter for surveillance of contraceptive pills: Secondary | ICD-10-CM

## 2021-08-01 ENCOUNTER — Other Ambulatory Visit: Payer: Self-pay | Admitting: Family Medicine

## 2021-08-01 ENCOUNTER — Other Ambulatory Visit: Payer: Self-pay

## 2021-08-01 DIAGNOSIS — Z3041 Encounter for surveillance of contraceptive pills: Secondary | ICD-10-CM

## 2021-08-05 ENCOUNTER — Ambulatory Visit: Payer: 59 | Admitting: Family Medicine

## 2021-08-05 ENCOUNTER — Other Ambulatory Visit: Payer: Self-pay

## 2021-08-05 ENCOUNTER — Encounter: Payer: Self-pay | Admitting: Family Medicine

## 2021-08-05 VITALS — BP 100/62 | HR 88 | Ht 60.0 in | Wt 168.0 lb

## 2021-08-05 DIAGNOSIS — Z3041 Encounter for surveillance of contraceptive pills: Secondary | ICD-10-CM

## 2021-08-05 DIAGNOSIS — Z0000 Encounter for general adult medical examination without abnormal findings: Secondary | ICD-10-CM

## 2021-08-05 MED ORDER — LEVONORGEST-ETH ESTRAD 91-DAY 0.15-0.03 MG PO TABS
1.0000 | ORAL_TABLET | Freq: Every day | ORAL | 3 refills | Status: DC
  Filled 2021-08-05: qty 91, 91d supply, fill #0
  Filled 2021-10-30: qty 91, 91d supply, fill #1
  Filled 2022-02-03: qty 91, 91d supply, fill #2
  Filled 2022-04-22 – 2022-04-28 (×2): qty 91, 91d supply, fill #3

## 2021-08-05 NOTE — Patient Instructions (Signed)
Mediterranean Diet ?A Mediterranean diet refers to food and lifestyle choices that are based on the traditions of countries located on the [place] Sea. It focuses on eating more fruits, vegetables, whole grains, beans, nuts, seeds, and heart-healthy fats, and eating less dairy, meat, eggs, and processed foods with added sugar, salt, and fat. This way of eating has been shown to help prevent certain conditions and improve outcomes for people who have chronic diseases, like kidney disease and heart disease. ?What are tips for following this plan? ?Reading food labels ?Check the serving size of packaged foods. For foods such as rice and pasta, the serving size refers to the amount of cooked product, not dry. ?Check the total fat in packaged foods. Avoid foods that have saturated fat or trans fats. ?Check the ingredient list for added sugars, such as corn syrup. ?Shopping ? ?Buy a variety of foods that offer a balanced diet, including: ?Fresh fruits and vegetables (produce). ?Grains, beans, nuts, and seeds. Some of these may be available in unpackaged forms or large amounts (in bulk). ?Fresh seafood. ?Poultry and eggs. ?Low-fat dairy products. ?Buy whole ingredients instead of prepackaged foods. ?Buy fresh fruits and vegetables in-season from local farmers markets. ?Buy plain frozen fruits and vegetables. ?If you do not have access to quality fresh seafood, buy precooked frozen shrimp or canned fish, such as tuna, salmon, or sardines. ?Stock your pantry so you always have certain foods on hand, such as olive oil, canned tuna, canned tomatoes, rice, pasta, and beans. ?Cooking ?Cook foods with extra-virgin olive oil instead of using butter or other vegetable oils. ?Have meat as a side dish, and have vegetables or grains as your main dish. This means having meat in small portions or adding small amounts of meat to foods like pasta or stew. ?Use beans or vegetables instead of meat in common dishes like chili or  lasagna. ?Experiment with different cooking methods. Try roasting, broiling, steaming, and saut?ing vegetables. ?Add frozen vegetables to soups, stews, pasta, or rice. ?Add nuts or seeds for added healthy fats and plant protein at each meal. You can add these to yogurt, salads, or vegetable dishes. ?Marinate fish or vegetables using olive oil, lemon juice, garlic, and fresh herbs. ?Meal planning ?Plan to eat one vegetarian meal one day each week. Try to work up to two vegetarian meals, if possible. ?Eat seafood two or more times a week. ?Have healthy snacks readily available, such as: ?Vegetable sticks with hummus. ?Greek yogurt. ?Fruit and nut trail mix. ?Eat balanced meals throughout the week. This includes: ?Fruit: 2-3 servings a day. ?Vegetables: 4-5 servings a day. ?Low-fat dairy: 2 servings a day. ?Fish, poultry, or lean meat: 1 serving a day. ?Beans and legumes: 2 or more servings a week. ?Nuts and seeds: 1-2 servings a day. ?Whole grains: 6-8 servings a day. ?Extra-virgin olive oil: 3-4 servings a day. ?Limit red meat and sweets to only a few servings a month. ?Lifestyle ? ?Cook and eat meals together with your family, when possible. ?Drink enough fluid to keep your urine pale yellow. ?Be physically active every day. This includes: ?Aerobic exercise like running or swimming. ?Leisure activities like gardening, walking, or housework. ?Get 7-8 hours of sleep each night. ?If recommended by your health care provider, drink red wine in moderation. This means 1 glass a day for nonpregnant women and 2 glasses a day for men. A glass of wine equals 5 oz (150 mL). ?What foods should I eat? ?Fruits ?Apples. Apricots. Avocado. Berries. Bananas. Cherries. Dates.   Figs. Grapes. Lemons. Melon. Oranges. Peaches. Plums. Pomegranate. ?Vegetables ?Artichokes. Beets. Broccoli. Cabbage. Carrots. Eggplant. Green beans. Chard. Kale. Spinach. Onions. Leeks. Peas. Squash. Tomatoes. Peppers. Radishes. ?Grains ?Whole-grain pasta. Brown  rice. Bulgur wheat. Polenta. Couscous. Whole-wheat bread. Oatmeal. Quinoa. ?Meats and other proteins ?Beans. Almonds. Sunflower seeds. Pine nuts. Peanuts. Cod. Salmon. Scallops. Shrimp. Tuna. Tilapia. Clams. Oysters. Eggs. Poultry without skin. ?Dairy ?Low-fat milk. Cheese. Greek yogurt. ?Fats and oils ?Extra-virgin olive oil. Avocado oil. Grapeseed oil. ?Beverages ?Water. Red wine. Herbal tea. ?Sweets and desserts ?Greek yogurt with honey. Baked apples. Poached pears. Trail mix. ?Seasonings and condiments ?Basil. Cilantro. Coriander. Cumin. Mint. Parsley. Sage. Rosemary. Tarragon. Garlic. Oregano. Thyme. Pepper. Balsamic vinegar. Tahini. Hummus. Tomato sauce. Olives. Mushrooms. ?The items listed above may not be a complete list of foods and beverages you can eat. Contact a dietitian for more information. ?What foods should I limit? ?This is a list of foods that should be eaten rarely or only on special occasions. ?Fruits ?Fruit canned in syrup. ?Vegetables ?Deep-fried potatoes (french fries). ?Grains ?Prepackaged pasta or rice dishes. Prepackaged cereal with added sugar. Prepackaged snacks with added sugar. ?Meats and other proteins ?Beef. Pork. Lamb. Poultry with skin. Hot dogs. Bacon. ?Dairy ?Ice cream. Sour cream. Whole milk. ?Fats and oils ?Butter. Canola oil. Vegetable oil. Beef fat (tallow). Lard. ?Beverages ?Juice. Sugar-sweetened soft drinks. Beer. Liquor and spirits. ?Sweets and desserts ?Cookies. Cakes. Pies. Candy. ?Seasonings and condiments ?Mayonnaise. Pre-made sauces and marinades. ?The items listed above may not be a complete list of foods and beverages you should limit. Contact a dietitian for more information. ?Summary ?The Mediterranean diet includes both food and lifestyle choices. ?Eat a variety of fresh fruits and vegetables, beans, nuts, seeds, and whole grains. ?Limit the amount of red meat and sweets that you eat. ?If recommended by your health care provider, drink red wine in moderation.  This means 1 glass a day for nonpregnant women and 2 glasses a day for men. A glass of wine equals 5 oz (150 mL). ?This information is not intended to replace advice given to you by your health care provider. Make sure you discuss any questions you have with your health care provider. ?Document Revised: 05/13/2019 Document Reviewed: 03/10/2019 ?Elsevier Patient Education ? 2023 Elsevier Inc. ? ?

## 2021-08-05 NOTE — Progress Notes (Signed)
? ? ?Date:  08/05/2021  ? ?Name:  Candice Bernard   DOB:  [birth year]   MRN:  [id number] ? ? ?Chief Complaint: Annual Exam (No pap or pelvic) ? ?Patient is a 31 year old female who presents for a comprehensive physical exam. The patient reports the following problems:  OCP maintenance. Health maintenance has been reviewed see discussion for pap/HPV.Candice Bernard is a 31 y.o. female who presents today for her Complete Annual Exam. She feels well. She reports exercising hiking. She reports she is sleeping fairly well.   ?  ? ? ?Lab Results  ?Component Value Date  ? NA 138 11/25/2019  ? K 4.7 11/25/2019  ? CO2 21 11/25/2019  ? GLUCOSE 91 11/25/2019  ? BUN 9 11/25/2019  ? CREATININE 0.74 11/25/2019  ? CALCIUM 9.4 11/25/2019  ? GFRNONAA 111 11/25/2019  ? ?Lab Results  ?Component Value Date  ? CHOL 160 11/25/2019  ? HDL 55 11/25/2019  ? [place] 87 11/25/2019  ? TRIG 95 11/25/2019  ? CHOLHDL 2.6 08/17/2017  ? ?No results found for: TSH ?No results found for: HGBA1C ?Lab Results  ?Component Value Date  ? WBC 6.8 11/25/2019  ? HGB 11.5 11/25/2019  ? HCT 35.1 11/25/2019  ? MCV 81 11/25/2019  ? PLT 311 11/25/2019  ? ?Lab Results  ?Component Value Date  ? ALT 20 01/25/2016  ? AST 24 01/25/2016  ? ALKPHOS 56 01/25/2016  ? BILITOT 0.5 01/25/2016  ? ?No results found for: 25OHVITD2, [place], VD25OH  ? ?Review of Systems  ?Constitutional:  Negative for chills and fever.  ?HENT:  Negative for drooling, ear discharge, ear pain and sore throat.   ?Respiratory:  Negative for cough, shortness of breath and wheezing.   ?Cardiovascular:  Negative for chest pain, palpitations and leg swelling.  ?Gastrointestinal:  Negative for abdominal pain, blood in stool, constipation, diarrhea and nausea.  ?Endocrine: Negative for polydipsia.  ?Genitourinary:  Negative for dysuria, frequency, hematuria and urgency.  ?Musculoskeletal:  Negative for back pain, myalgias and neck pain.  ?Skin:  Negative for rash.  ?Allergic/Immunologic:  Negative for environmental allergies.  ?Neurological:  Positive for headaches. Negative for dizziness.  ?     History of migraines  ?Hematological:  Does not bruise/bleed easily.  ?Psychiatric/Behavioral:  Negative for suicidal ideas. The patient is not nervous/anxious.   ? ?Patient Active Problem List  ? Diagnosis Date Noted  ? Patellofemoral pain syndrome of right knee 09/30/2017  ? Plantar fasciitis of left foot 09/30/2017  ? Onychocryptosis 04/06/2012  ? Pain in toe 04/06/2012  ? Allergic rhinitis due to pollen 10/30/2010  ? Dysmenorrhea 10/30/2010  ? Migraine without aura 10/30/2010  ? Tension type headache 10/30/2010  ? ? ?No Known Allergies ? ?Past Surgical History:  ?Procedure Laterality Date  ? WISDOM TOOTH EXTRACTION    ? ? ?Social History  ? ?Tobacco Use  ? Smoking status: Never  ? Smokeless tobacco: Never  ?Substance Use Topics  ? Alcohol use: Yes  ?  Alcohol/week: 0.0 standard drinks  ?  Comment: rare  ? Drug use: No  ? ? ? ?Medication list has been reviewed and updated. ? ?Current Meds  ?Medication Sig  ? chlorzoxazone (PARAFON) 500 MG tablet Take 500 mg by mouth every 6 (six) hours as needed for muscle spasms. Dr Warden Fillers  ? chlorzoxazone (PARAFON) 500 MG tablet 1 TAB PRN headache.  ? fluticasone (FLONASE) 50 MCG/ACT nasal spray Place into the nose.  ? Galcanezumab-gnlm (EMGALITY) 120 MG/ML SOAJ Dispense  ONE- 120 mg AUTOINJECTOR for migraine prevention once per month  ? levETIRAcetam (KEPPRA) 1000 MG tablet Take 1,000 mg by mouth 2 (two) times daily.  ? levETIRAcetam (KEPPRA) 1000 MG tablet take 1 tablet by oral route 2 times a day for 30 days  ? levETIRAcetam (KEPPRA) 1000 MG tablet take 1 tablet by oral route 2 times a day for 30 days  ? levonorgestrel-ethinyl estradiol (SEASONALE) 0.15-0.03 MG tablet Take 1 tablet by mouth daily.  ? rizatriptan (MAXALT-MLT) 10 MG disintegrating tablet 1 TAB PRN migraine; may repeat once after 2 hours  ? rizatriptan (MAXALT-MLT) 10 MG disintegrating tablet 1  TAB PRN migraine; may repeat once after 2 hours  ? [DISCONTINUED] chlorzoxazone (PARAFON) 500 MG tablet 1 TAB PRN headache.  ? [DISCONTINUED] Galcanezumab-gnlm (EMGALITY) 120 MG/ML SOAJ Use 1 AUTOINJECTOR for migraine prevention once per month  ? ? ? ?  08/05/2021  ?  9:45 AM 11/25/2019  ?  9:42 AM  ?GAD 7 : Generalized Anxiety Score  ?Nervous, Anxious, on Edge 0 0  ?Control/stop worrying 0 0  ?Worry too much - different things 0 0  ?Trouble relaxing 0 1  ?Restless 0 0  ?Easily annoyed or irritable 0 0  ?Afraid - awful might happen 0 0  ?Total GAD 7 Score 0 1  ?Anxiety Difficulty Not difficult at all Not difficult at all  ? ? ? ?  08/05/2021  ?  9:44 AM  ?Depression screen PHQ 2/9  ?Decreased Interest 0  ?Down, Depressed, Hopeless 0  ?PHQ - 2 Score 0  ?Altered sleeping 0  ?Tired, decreased energy 0  ?Change in appetite 0  ?Feeling bad or failure about yourself  0  ?Trouble concentrating 0  ?Moving slowly or fidgety/restless 0  ?Suicidal thoughts 0  ?PHQ-9 Score 0  ?Difficult doing work/chores Not difficult at all  ? ? ?BP Readings from Last 3 Encounters:  ?08/05/21 100/62  ?02/13/21 133/87  ?11/25/19 128/84  ? ? ?Physical Exam ?Vitals and nursing note reviewed.  ?Constitutional:   ?   Appearance: Normal appearance. She is well-developed.  ?HENT:  ?   Head: Normocephalic.  ?   Jaw: There is normal jaw occlusion.  ?   Right Ear: Hearing, tympanic membrane, ear canal and external ear normal.  ?   Left Ear: Hearing, tympanic membrane, ear canal and external ear normal.  ?   Nose: Nose normal. No congestion or rhinorrhea.  ?   Mouth/Throat:  ?   Lips: Pink.  ?   Mouth: Mucous membranes are moist.  ?   Dentition: Normal dentition.  ?   Tongue: No lesions.  ?   Palate: No lesions.  ?   Pharynx: Oropharynx is clear. Uvula midline. No pharyngeal swelling, oropharyngeal exudate, posterior oropharyngeal erythema or uvula swelling.  ?   Tonsils: No tonsillar exudate or tonsillar abscesses.  ?Eyes:  ?   General: Lids are normal.  Lids are everted, no foreign bodies appreciated. Vision grossly intact. Gaze aligned appropriately. No scleral icterus.    ?   Right eye: No discharge.     ?   Left eye: No discharge.  ?   Extraocular Movements: Extraocular movements intact.  ?   Conjunctiva/sclera: Conjunctivae normal.  ?   Right eye: Right conjunctiva is not injected.  ?   Left eye: Left conjunctiva is not injected.  ?   Pupils: Pupils are equal, round, and reactive to light.  ?   Funduscopic exam: ?   Right eye: Red reflex  present.     ?   Left eye: Red reflex present. ?Neck:  ?   Thyroid: No thyroid mass, thyromegaly or thyroid tenderness.  ?   Vascular: Normal carotid pulses. No carotid bruit, hepatojugular reflux or JVD.  ?   Trachea: Trachea and phonation normal. No tracheal deviation.  ?Cardiovascular:  ?   Rate and Rhythm: Normal rate and regular rhythm.  ?   Pulses: Normal pulses.     ?     Carotid pulses are 2+ on the right side and 2+ on the left side. ?     Radial pulses are 2+ on the right side and 2+ on the left side.  ?     Femoral pulses are 2+ on the right side and 2+ on the left side. ?     Popliteal pulses are 2+ on the right side and 2+ on the left side.  ?     Dorsalis pedis pulses are 2+ on the right side and 2+ on the left side.  ?     Posterior tibial pulses are 2+ on the right side and 2+ on the left side.  ?   Heart sounds: Normal heart sounds, S1 normal and S2 normal. No murmur heard. ?No systolic murmur is present.  ?No diastolic murmur is present.  ?  No friction rub. No gallop. No S3 or S4 sounds.  ?Pulmonary:  ?   Effort: Pulmonary effort is normal. No respiratory distress.  ?   Breath sounds: Normal breath sounds. No decreased breath sounds, wheezing, rhonchi or rales.  ?Chest:  ?Breasts: ?   Right: Normal. No swelling, bleeding, inverted nipple, mass, nipple discharge, skin change or tenderness.  ?   Left: Normal. No swelling, bleeding, inverted nipple, mass, nipple discharge, skin change or tenderness.  ?Abdominal:   ?   General: Bowel sounds are normal.  ?   Palpations: Abdomen is soft. There is no hepatomegaly, splenomegaly or mass.  ?   Tenderness: There is no abdominal tenderness. There is no guarding or rebound.  ?   H

## 2021-08-06 DIAGNOSIS — M25571 Pain in right ankle and joints of right foot: Secondary | ICD-10-CM | POA: Diagnosis not present

## 2021-08-06 LAB — RENAL FUNCTION PANEL
Albumin: 4.1 g/dL (ref 3.9–5.0)
BUN/Creatinine Ratio: 10 (ref 9–23)
BUN: 7 mg/dL (ref 6–20)
CO2: 20 mmol/L (ref 20–29)
Calcium: 8.9 mg/dL (ref 8.7–10.2)
Chloride: 107 mmol/L — ABNORMAL HIGH (ref 96–106)
Creatinine, Ser: 0.73 mg/dL (ref 0.57–1.00)
Glucose: 92 mg/dL (ref 70–99)
Phosphorus: 2.6 mg/dL — ABNORMAL LOW (ref 3.0–4.3)
Potassium: 4.5 mmol/L (ref 3.5–5.2)
Sodium: 140 mmol/L (ref 134–144)
eGFR: 114 mL/min/{1.73_m2} (ref >59)

## 2021-08-06 LAB — CBC WITH DIFFERENTIAL/PLATELET
Basophils Absolute: 0 10*3/uL (ref 0.0–0.2)
Basos: 1 %
EOS (ABSOLUTE): 0.3 10*3/uL (ref 0.0–0.4)
Eos: 6 %
Hematocrit: 35.5 % (ref 34.0–46.6)
Hemoglobin: 11.5 g/dL (ref 11.1–15.9)
Immature Grans (Abs): 0 10*3/uL (ref 0.0–0.1)
Immature Granulocytes: 0 %
Lymphocytes Absolute: 1.5 10*3/uL (ref 0.7–3.1)
Lymphs: 27 %
MCH: 26.4 pg — ABNORMAL LOW (ref 26.6–33.0)
MCHC: 32.4 g/dL (ref 31.5–35.7)
MCV: 82 fL (ref 79–97)
Monocytes Absolute: 0.3 10*3/uL (ref 0.1–0.9)
Monocytes: 6 %
Neutrophils Absolute: 3.3 10*3/uL (ref 1.4–7.0)
Neutrophils: 60 %
Platelets: 304 10*3/uL (ref 150–450)
RBC: 4.35 x10E6/uL (ref 3.77–5.28)
RDW: 13.4 % (ref 11.7–15.4)
WBC: 5.4 10*3/uL (ref 3.4–10.8)

## 2021-08-06 LAB — LIPID PANEL WITH LDL/HDL RATIO
Cholesterol, Total: 158 mg/dL (ref 100–199)
HDL: 48 mg/dL (ref >39)
LDL Chol Calc (NIH): 84 mg/dL (ref 0–99)
LDL/HDL Ratio: 1.8 ratio (ref 0.0–3.2)
Triglycerides: 151 mg/dL — ABNORMAL HIGH (ref 0–149)
VLDL Cholesterol Cal: 26 mg/dL (ref 5–40)

## 2021-08-12 ENCOUNTER — Other Ambulatory Visit: Payer: Self-pay

## 2021-08-27 DIAGNOSIS — R102 Pelvic and perineal pain: Secondary | ICD-10-CM | POA: Diagnosis not present

## 2021-08-27 DIAGNOSIS — N920 Excessive and frequent menstruation with regular cycle: Secondary | ICD-10-CM | POA: Diagnosis not present

## 2021-08-28 ENCOUNTER — Other Ambulatory Visit: Payer: Self-pay

## 2021-09-09 ENCOUNTER — Other Ambulatory Visit: Payer: Self-pay

## 2021-09-24 ENCOUNTER — Other Ambulatory Visit: Payer: Self-pay

## 2021-09-24 DIAGNOSIS — M542 Cervicalgia: Secondary | ICD-10-CM | POA: Diagnosis not present

## 2021-09-24 DIAGNOSIS — G43719 Chronic migraine without aura, intractable, without status migrainosus: Secondary | ICD-10-CM | POA: Diagnosis not present

## 2021-09-24 DIAGNOSIS — N920 Excessive and frequent menstruation with regular cycle: Secondary | ICD-10-CM | POA: Diagnosis not present

## 2021-09-24 DIAGNOSIS — R102 Pelvic and perineal pain: Secondary | ICD-10-CM | POA: Diagnosis not present

## 2021-09-24 DIAGNOSIS — M791 Myalgia, unspecified site: Secondary | ICD-10-CM | POA: Diagnosis not present

## 2021-09-24 MED ORDER — LEVETIRACETAM 1000 MG PO TABS
ORAL_TABLET | ORAL | 2 refills | Status: DC
  Filled 2021-09-24: qty 60, 30d supply, fill #0
  Filled 2021-10-30: qty 60, 30d supply, fill #1
  Filled 2021-12-04: qty 60, 30d supply, fill #2

## 2021-09-24 MED ORDER — RIZATRIPTAN BENZOATE 10 MG PO TBDP
ORAL_TABLET | ORAL | 2 refills | Status: AC
  Filled 2021-09-24: qty 6, 15d supply, fill #0

## 2021-09-24 MED ORDER — EMGALITY 120 MG/ML ~~LOC~~ SOAJ
SUBCUTANEOUS | 2 refills | Status: DC
  Filled 2021-09-24 – 2021-09-26 (×2): qty 1, 30d supply, fill #0
  Filled 2021-10-31: qty 1, 30d supply, fill #1
  Filled 2021-11-06: qty 1, 28d supply, fill #1
  Filled 2021-11-11 (×2): qty 1, 30d supply, fill #1
  Filled 2021-12-04: qty 1, 30d supply, fill #2

## 2021-09-26 ENCOUNTER — Other Ambulatory Visit: Payer: Self-pay

## 2021-10-02 ENCOUNTER — Other Ambulatory Visit: Payer: Self-pay

## 2021-10-31 ENCOUNTER — Other Ambulatory Visit: Payer: Self-pay

## 2021-11-01 ENCOUNTER — Other Ambulatory Visit: Payer: Self-pay

## 2021-11-06 ENCOUNTER — Other Ambulatory Visit: Payer: Self-pay

## 2021-11-07 ENCOUNTER — Other Ambulatory Visit: Payer: Self-pay

## 2021-11-11 ENCOUNTER — Other Ambulatory Visit: Payer: Self-pay

## 2021-12-04 DIAGNOSIS — D485 Neoplasm of uncertain behavior of skin: Secondary | ICD-10-CM | POA: Diagnosis not present

## 2021-12-04 DIAGNOSIS — L821 Other seborrheic keratosis: Secondary | ICD-10-CM | POA: Diagnosis not present

## 2021-12-04 DIAGNOSIS — D2221 Melanocytic nevi of right ear and external auricular canal: Secondary | ICD-10-CM | POA: Diagnosis not present

## 2021-12-05 ENCOUNTER — Other Ambulatory Visit: Payer: Self-pay

## 2021-12-24 ENCOUNTER — Other Ambulatory Visit: Payer: Self-pay

## 2021-12-24 DIAGNOSIS — M791 Myalgia, unspecified site: Secondary | ICD-10-CM | POA: Diagnosis not present

## 2021-12-24 DIAGNOSIS — G43719 Chronic migraine without aura, intractable, without status migrainosus: Secondary | ICD-10-CM | POA: Diagnosis not present

## 2021-12-24 DIAGNOSIS — M542 Cervicalgia: Secondary | ICD-10-CM | POA: Diagnosis not present

## 2021-12-24 MED ORDER — ZONISAMIDE 25 MG PO CAPS
ORAL_CAPSULE | ORAL | 1 refills | Status: DC
  Filled 2021-12-24: qty 120, 30d supply, fill #0
  Filled 2022-02-17: qty 120, 30d supply, fill #1

## 2021-12-24 MED ORDER — EMGALITY 120 MG/ML ~~LOC~~ SOAJ
SUBCUTANEOUS | 2 refills | Status: AC
  Filled 2021-12-24: qty 1, 30d supply, fill #0
  Filled 2022-02-03: qty 1, 30d supply, fill #1
  Filled 2022-05-06: qty 1, 30d supply, fill #2

## 2021-12-24 MED ORDER — RIZATRIPTAN BENZOATE 10 MG PO TBDP
ORAL_TABLET | ORAL | 2 refills | Status: AC
  Filled 2021-12-24: qty 6, 10d supply, fill #0

## 2021-12-24 MED ORDER — LEVETIRACETAM 500 MG PO TABS
ORAL_TABLET | ORAL | 1 refills | Status: AC
  Filled 2021-12-24: qty 60, 30d supply, fill #0

## 2021-12-25 ENCOUNTER — Other Ambulatory Visit: Payer: Self-pay

## 2021-12-27 ENCOUNTER — Other Ambulatory Visit: Payer: Self-pay

## 2021-12-30 ENCOUNTER — Other Ambulatory Visit: Payer: Self-pay

## 2022-01-21 DIAGNOSIS — D2221 Melanocytic nevi of right ear and external auricular canal: Secondary | ICD-10-CM | POA: Diagnosis not present

## 2022-01-21 DIAGNOSIS — D2321 Other benign neoplasm of skin of right ear and external auricular canal: Secondary | ICD-10-CM | POA: Diagnosis not present

## 2022-02-03 ENCOUNTER — Other Ambulatory Visit: Payer: Self-pay

## 2022-02-18 ENCOUNTER — Other Ambulatory Visit: Payer: Self-pay

## 2022-02-19 ENCOUNTER — Other Ambulatory Visit: Payer: Self-pay

## 2022-02-19 DIAGNOSIS — M791 Myalgia, unspecified site: Secondary | ICD-10-CM | POA: Diagnosis not present

## 2022-02-19 DIAGNOSIS — M542 Cervicalgia: Secondary | ICD-10-CM | POA: Diagnosis not present

## 2022-02-19 DIAGNOSIS — G43719 Chronic migraine without aura, intractable, without status migrainosus: Secondary | ICD-10-CM | POA: Diagnosis not present

## 2022-02-19 MED ORDER — ZONISAMIDE 100 MG PO CAPS
100.0000 mg | ORAL_CAPSULE | Freq: Every day | ORAL | 2 refills | Status: DC
  Filled 2022-02-19: qty 30, 30d supply, fill #0
  Filled 2022-03-09: qty 30, 30d supply, fill #1
  Filled 2022-04-16: qty 30, 30d supply, fill #2

## 2022-02-19 MED ORDER — EMGALITY 120 MG/ML ~~LOC~~ SOAJ
SUBCUTANEOUS | 2 refills | Status: AC
  Filled 2022-02-19: qty 1, 28d supply, fill #0
  Filled 2022-03-09: qty 1, 30d supply, fill #0
  Filled 2022-04-03: qty 1, 30d supply, fill #1

## 2022-02-19 MED ORDER — CHLORZOXAZONE 500 MG PO TABS
ORAL_TABLET | ORAL | 1 refills | Status: DC
  Filled 2022-02-19: qty 30, 30d supply, fill #0

## 2022-02-19 MED ORDER — RIZATRIPTAN BENZOATE 10 MG PO TBDP
ORAL_TABLET | ORAL | 2 refills | Status: AC
  Filled 2022-02-19: qty 6, 15d supply, fill #0

## 2022-03-10 ENCOUNTER — Other Ambulatory Visit: Payer: Self-pay

## 2022-03-12 ENCOUNTER — Other Ambulatory Visit: Payer: Self-pay

## 2022-03-17 ENCOUNTER — Other Ambulatory Visit: Payer: Self-pay

## 2022-04-03 ENCOUNTER — Other Ambulatory Visit: Payer: Self-pay

## 2022-04-23 ENCOUNTER — Other Ambulatory Visit: Payer: Self-pay

## 2022-05-21 ENCOUNTER — Other Ambulatory Visit: Payer: Self-pay

## 2022-05-22 ENCOUNTER — Other Ambulatory Visit: Payer: Self-pay

## 2022-05-22 MED ORDER — ZONISAMIDE 100 MG PO CAPS
100.0000 mg | ORAL_CAPSULE | Freq: Every day | ORAL | 0 refills | Status: AC
  Filled 2022-05-22: qty 30, 30d supply, fill #0

## 2022-05-26 ENCOUNTER — Other Ambulatory Visit: Payer: Self-pay

## 2022-05-26 DIAGNOSIS — G43719 Chronic migraine without aura, intractable, without status migrainosus: Secondary | ICD-10-CM | POA: Diagnosis not present

## 2022-05-26 MED ORDER — CHLORZOXAZONE 500 MG PO TABS
500.0000 mg | ORAL_TABLET | ORAL | 1 refills | Status: AC | PRN
  Filled 2022-05-26: qty 30, 30d supply, fill #0

## 2022-05-26 MED ORDER — ZONISAMIDE 50 MG PO CAPS
150.0000 mg | ORAL_CAPSULE | Freq: Every day | ORAL | 2 refills | Status: DC
  Filled 2022-05-26: qty 90, 30d supply, fill #0
  Filled 2022-07-07: qty 90, 30d supply, fill #1
  Filled 2022-08-06: qty 90, 30d supply, fill #2

## 2022-05-26 MED ORDER — EMGALITY 120 MG/ML ~~LOC~~ SOAJ
120.0000 mg | SUBCUTANEOUS | 2 refills | Status: AC
  Filled 2022-05-26 – 2022-06-05 (×2): qty 1, 30d supply, fill #0
  Filled 2022-07-07: qty 1, 30d supply, fill #1
  Filled 2022-08-06: qty 1, 30d supply, fill #2

## 2022-05-26 MED ORDER — RIZATRIPTAN BENZOATE 10 MG PO TBDP
10.0000 mg | ORAL_TABLET | ORAL | 2 refills | Status: AC | PRN
  Filled 2022-05-26: qty 6, 30d supply, fill #0

## 2022-05-27 ENCOUNTER — Other Ambulatory Visit: Payer: Self-pay

## 2022-06-05 ENCOUNTER — Other Ambulatory Visit: Payer: Self-pay

## 2022-07-07 ENCOUNTER — Other Ambulatory Visit: Payer: Self-pay | Admitting: Family Medicine

## 2022-07-07 ENCOUNTER — Other Ambulatory Visit: Payer: Self-pay

## 2022-07-07 DIAGNOSIS — Z3041 Encounter for surveillance of contraceptive pills: Secondary | ICD-10-CM

## 2022-07-08 ENCOUNTER — Other Ambulatory Visit: Payer: Self-pay

## 2022-07-08 NOTE — Telephone Encounter (Signed)
Requested Prescriptions  Pending Prescriptions Disp Refills   levonorgestrel-ethinyl estradiol (SEASONALE) 0.15-0.03 MG tablet 91 tablet 3    Sig: Take 1 tablet by mouth daily.     OB/GYN:  Contraceptives Passed - 07/07/2022  4:32 PM      Passed - Last BP in normal range    BP Readings from Last 1 Encounters:  08/05/21 100/62         Passed - Valid encounter within last 12 months    Recent Outpatient Visits           11 months ago Annual physical exam   [facility] [place], Deanna C, MD   2 years ago Annual physical exam   [facility] [place], Deanna C, MD   3 years ago Encounter for physical examination of prospective foster parent   [place] [facility] [place], Deanna C, MD   4 years ago Encounter for surveillance of contraceptive pills   [facility] [place], Deanna C, MD   4 years ago Annual physical exam   [facility] [place], [place], MD       Future Appointments             In 1 month Juline Patch, MD [place] [facility], [place] - Patient is not a smoker

## 2022-07-16 ENCOUNTER — Other Ambulatory Visit: Payer: Self-pay | Admitting: Family Medicine

## 2022-07-16 ENCOUNTER — Other Ambulatory Visit: Payer: Self-pay

## 2022-07-16 DIAGNOSIS — Z3041 Encounter for surveillance of contraceptive pills: Secondary | ICD-10-CM

## 2022-07-16 NOTE — Telephone Encounter (Signed)
Unable to refill per protocol, Rx request is too soon. Duplicate request.  Requested Prescriptions  Pending Prescriptions Disp Refills   levonorgestrel-ethinyl estradiol (SEASONALE) 0.15-0.03 MG tablet 91 tablet 3    Sig: Take 1 tablet by mouth daily.     OB/GYN:  Contraceptives Passed - 07/16/2022  9:08 AM      Passed - Last BP in normal range    BP Readings from Last 1 Encounters:  08/05/21 100/62         Passed - Valid encounter within last 12 months    Recent Outpatient Visits           11 months ago Annual physical exam   [facility] [place], Deanna C, MD   2 years ago Annual physical exam   [place] [facility] [place], Deanna C, MD   3 years ago Encounter for physical examination of prospective foster parent   [place] [facility] [place], Deanna C, MD   4 years ago Encounter for surveillance of contraceptive pills   [place] [facility] [place], Deanna C, MD   4 years ago Annual physical exam   [place] [facility] [place], [place], MD       Future Appointments             In 3 weeks Juline Patch, MD [place] at [facility], [place] - Patient is not a smoker

## 2022-07-23 DIAGNOSIS — Z86018 Personal history of other benign neoplasm: Secondary | ICD-10-CM | POA: Diagnosis not present

## 2022-07-23 DIAGNOSIS — L578 Other skin changes due to chronic exposure to nonionizing radiation: Secondary | ICD-10-CM | POA: Diagnosis not present

## 2022-07-25 ENCOUNTER — Other Ambulatory Visit: Payer: Self-pay

## 2022-07-25 ENCOUNTER — Other Ambulatory Visit: Payer: Self-pay | Admitting: Family Medicine

## 2022-07-25 DIAGNOSIS — Z3041 Encounter for surveillance of contraceptive pills: Secondary | ICD-10-CM

## 2022-07-25 MED ORDER — LEVONORGEST-ETH ESTRAD 91-DAY 0.15-0.03 MG PO TABS
1.0000 | ORAL_TABLET | Freq: Every day | ORAL | 0 refills | Status: DC
  Filled 2022-07-25: qty 91, 91d supply, fill #0

## 2022-07-25 NOTE — Telephone Encounter (Signed)
Requested Prescriptions  Pending Prescriptions Disp Refills   levonorgestrel-ethinyl estradiol (SEASONALE) 0.15-0.03 MG tablet 91 tablet 0    Sig: Take 1 tablet by mouth daily.     OB/GYN:  Contraceptives Passed - 07/25/2022  9:24 AM      Passed - Last BP in normal range    BP Readings from Last 1 Encounters:  08/05/21 100/62         Passed - Valid encounter within last 12 months    Recent Outpatient Visits           11 months ago Annual physical exam   [place] [facility], [facility]   2 years ago Annual physical exam   [place] [facility], MD   3 years ago Encounter for physical examination of prospective foster parent   [facility], MD   4 years ago Encounter for surveillance of contraceptive pills    [facility], MD   4 years ago Annual physical exam   [facility], MD       Future Appointments             In 2 weeks Duanne Limerick, MD [facility] at [facility], [facility]            Passed - Patient is not a smoker

## 2022-07-25 NOTE — Telephone Encounter (Signed)
Medication Refill - Medication: evonorgestrel-ethinyl estradiol (SEASONALE) 0.15-0.03 MG tablet   Has the patient contacted their pharmacy? Yes.   Pharmacy stated they have sent several request to the practice with no response  Preferred Pharmacy (with phone number or street name):  [facility] Phone: [phone]  Fax: [phone]     Has the patient been seen for an appointment in the last year OR does the patient have an upcoming appointment? Yes.    Patient stated she will take her last pill on Sunday and need to have her meds refilled before the weekend. Please advise

## 2022-08-11 ENCOUNTER — Ambulatory Visit (INDEPENDENT_AMBULATORY_CARE_PROVIDER_SITE_OTHER): Payer: Commercial Managed Care - PPO | Admitting: Family Medicine

## 2022-08-11 ENCOUNTER — Encounter: Payer: Self-pay | Admitting: Family Medicine

## 2022-08-11 VITALS — BP 124/70 | HR 100 | Ht 60.0 in | Wt 164.0 lb

## 2022-08-11 DIAGNOSIS — Z0000 Encounter for general adult medical examination without abnormal findings: Secondary | ICD-10-CM

## 2022-08-11 NOTE — Progress Notes (Unsigned)
Date:  08/11/2022   Name:  Candice Bernard   DOB:  April 16, 1991   MRN:  [id number]   Chief Complaint: Annual Exam  Patient is a 32 year old female who presents for a comprehensive physical exam. The patient reports the following problems: "just the migraines". Health maintenance has been reviewed up to date.      Lab Results  Component Value Date   NA 140 08/05/2021   K 4.5 08/05/2021   CO2 20 08/05/2021   GLUCOSE 92 08/05/2021   BUN 7 08/05/2021   CREATININE 0.73 08/05/2021   CALCIUM 8.9 08/05/2021   EGFR 114 08/05/2021   GFRNONAA 111 11/25/2019   Lab Results  Component Value Date   CHOL 158 08/05/2021   HDL 48 08/05/2021   LDLCALC 84 08/05/2021   TRIG 151 (H) 08/05/2021   CHOLHDL 2.6 08/17/2017   No results found for: "TSH" No results found for: "HGBA1C" Lab Results  Component Value Date   WBC 5.4 08/05/2021   HGB 11.5 08/05/2021   HCT 35.5 08/05/2021   MCV 82 08/05/2021   PLT 304 08/05/2021   Lab Results  Component Value Date   ALT 20 01/25/2016   AST 24 01/25/2016   ALKPHOS 56 01/25/2016   BILITOT 0.5 01/25/2016   No results found for: "25OHVITD2", "25OHVITD3", "VD25OH"   Review of Systems  Constitutional: Negative.  Negative for chills, fatigue, fever and unexpected weight change.  HENT:  Negative for congestion, ear discharge, ear pain, rhinorrhea, sinus pressure, sneezing and sore throat.   Respiratory:  Negative for cough, shortness of breath, wheezing and stridor.   Gastrointestinal:  Negative for abdominal pain, blood in stool, constipation, diarrhea and nausea.  Genitourinary:  Negative for dysuria, flank pain, frequency, hematuria, urgency and vaginal discharge.  Musculoskeletal:  Negative for arthralgias, back pain and myalgias.  Skin:  Negative for rash.  Neurological:  Negative for dizziness, weakness and headaches.  Hematological:  Negative for adenopathy. Does not bruise/bleed easily.  Psychiatric/Behavioral:  Negative for dysphoric  mood. The patient is not nervous/anxious.     Patient Active Problem List   Diagnosis Date Noted   Patellofemoral pain syndrome of right knee 09/30/2017   Plantar fasciitis of left foot 09/30/2017   Onychocryptosis 04/06/2012   Pain in toe 04/06/2012   Allergic rhinitis due to pollen 10/30/2010   Dysmenorrhea 10/30/2010   Migraine without aura 10/30/2010   Tension type headache 10/30/2010    No Known Allergies  Past Surgical History:  Procedure Laterality Date   WISDOM TOOTH EXTRACTION      Social History   Tobacco Use   Smoking status: Never   Smokeless tobacco: Never  Substance Use Topics   Alcohol use: Yes    Alcohol/week: 0.0 standard drinks of alcohol    Comment: rare   Drug use: No     Medication list has been reviewed and updated.  Current Meds  Medication Sig   chlorzoxazone (PARAFON) 500 MG tablet Take 1 tablet (500 mg total) by mouth as needed for headache   fluticasone (FLONASE) 50 MCG/ACT nasal spray Place into the nose.   Galcanezumab-gnlm (EMGALITY) 120 MG/ML SOAJ Inject 120 mg into the skin every 30 (thirty) days for migraine prevention   levonorgestrel-ethinyl estradiol (SEASONALE) 0.15-0.03 MG tablet Take 1 tablet by mouth daily.   rizatriptan (MAXALT-MLT) 10 MG disintegrating tablet Take 1 tablet (10 mg total) by mouth as needed for migraine. may repeat once after 2 hours   zonisamide (ZONEGRAN) 50  MG capsule Take 3 capsules (150 mg total) by mouth daily.       08/11/2022    8:38 AM 08/05/2021    9:45 AM 11/25/2019    9:42 AM  GAD 7 : Generalized Anxiety Score  Nervous, Anxious, on Edge 0 0 0  Control/stop worrying 0 0 0  Worry too much - different things 0 0 0  Trouble relaxing 0 0 1  Restless 0 0 0  Easily annoyed or irritable 0 0 0  Afraid - awful might happen 0 0 0  Total GAD 7 Score 0 0 1  Anxiety Difficulty Not difficult at all Not difficult at all Not difficult at all       08/11/2022    8:37 AM 08/05/2021    9:44 AM 11/25/2019     9:42 AM  Depression screen PHQ 2/9  Decreased Interest 0 0 0  Down, Depressed, Hopeless 0 0 0  PHQ - 2 Score 0 0 0  Altered sleeping 2 0 1  Tired, decreased energy 0 0 0  Change in appetite 0 0 0  Feeling bad or failure about yourself  0 0 0  Trouble concentrating 0 0 0  Moving slowly or fidgety/restless 0 0 0  Suicidal thoughts 0 0 0  PHQ-9 Score 2 0 1  Difficult doing work/chores Not difficult at all Not difficult at all Not difficult at all    BP Readings from Last 3 Encounters:  08/11/22 124/70  08/05/21 100/62  02/13/21 133/87    Physical Exam Vitals and nursing note reviewed. Exam conducted with a chaperone present.  Constitutional:      General: She is not in acute distress.    Appearance: She is not diaphoretic.  HENT:     Head: Normocephalic and atraumatic.     Right Ear: External ear normal.     Left Ear: External ear normal.     Nose: Nose normal.  Eyes:     General:        Right eye: No discharge.        Left eye: No discharge.     Conjunctiva/sclera: Conjunctivae normal.     Pupils: Pupils are equal, round, and reactive to light.  Neck:     Thyroid: No thyromegaly.     Vascular: No JVD.  Cardiovascular:     Rate and Rhythm: Normal rate and regular rhythm.     Heart sounds: Normal heart sounds. No murmur heard.    No friction rub. No gallop.  Pulmonary:     Effort: Pulmonary effort is normal.     Breath sounds: Normal breath sounds.  Abdominal:     General: Bowel sounds are normal.     Palpations: Abdomen is soft. There is no mass.     Tenderness: There is no abdominal tenderness. There is no guarding.  Musculoskeletal:        General: Normal range of motion.     Cervical back: Normal range of motion and neck supple.  Lymphadenopathy:     Cervical: No cervical adenopathy.  Skin:    General: Skin is warm and dry.  Neurological:     Mental Status: She is alert.     Deep Tendon Reflexes: Reflexes are normal and symmetric.     Wt Readings from  Last 3 Encounters:  08/11/22 164 lb (74.4 kg)  08/05/21 168 lb (76.2 kg)  02/13/21 165 lb (74.8 kg)    BP 124/70   Pulse 100   Ht  5' (1.524 m)   Wt 164 lb (74.4 kg)   LMP 04/22/2022 (Approximate)   SpO2 99%   BMI 32.03 kg/m   Assessment and Plan: Candice Bernard is a 32 y.o. female who presents today for her Complete Annual Exam. She feels well. She reports exercising . She reports she is sleeping poorly.  Immunizations are reviewed and recommendations provided.   Age appropriate screening tests are discussed. Counseling given for risk factor reduction interventions.     Elizabeth Sauer, MD

## 2022-08-12 ENCOUNTER — Encounter: Payer: Self-pay | Admitting: Family Medicine

## 2022-08-12 DIAGNOSIS — Z0000 Encounter for general adult medical examination without abnormal findings: Secondary | ICD-10-CM | POA: Diagnosis not present

## 2022-08-13 LAB — COMPREHENSIVE METABOLIC PANEL
ALT: 14 IU/L (ref 0–32)
AST: 17 IU/L (ref 0–40)
Albumin/Globulin Ratio: 1.5 (ref 1.2–2.2)
Albumin: 4.1 g/dL (ref 4.0–5.0)
Alkaline Phosphatase: 100 IU/L (ref 44–121)
BUN/Creatinine Ratio: 10 (ref 9–23)
BUN: 8 mg/dL (ref 6–20)
Bilirubin Total: 0.4 mg/dL (ref 0.0–1.2)
CO2: 18 mmol/L — ABNORMAL LOW (ref 20–29)
Calcium: 9.4 mg/dL (ref 8.7–10.2)
Chloride: 108 mmol/L — ABNORMAL HIGH (ref 96–106)
Creatinine, Ser: 0.8 mg/dL (ref 0.57–1.00)
Globulin, Total: 2.7 g/dL (ref 1.5–4.5)
Glucose: 103 mg/dL — ABNORMAL HIGH (ref 70–99)
Potassium: 4.4 mmol/L (ref 3.5–5.2)
Sodium: 139 mmol/L (ref 134–144)
Total Protein: 6.8 g/dL (ref 6.0–8.5)
eGFR: 102 mL/min/{1.73_m2} (ref >59)

## 2022-08-13 LAB — LIPID PANEL WITH LDL/HDL RATIO
Cholesterol, Total: 157 mg/dL (ref 100–199)
HDL: 55 mg/dL (ref >39)
LDL Chol Calc (NIH): 82 mg/dL (ref 0–99)
LDL/HDL Ratio: 1.5 ratio (ref 0.0–3.2)
Triglycerides: 111 mg/dL (ref 0–149)
VLDL Cholesterol Cal: 20 mg/dL (ref 5–40)

## 2022-09-02 DIAGNOSIS — Z124 Encounter for screening for malignant neoplasm of cervix: Secondary | ICD-10-CM | POA: Diagnosis not present

## 2022-09-02 DIAGNOSIS — N920 Excessive and frequent menstruation with regular cycle: Secondary | ICD-10-CM | POA: Diagnosis not present

## 2022-09-02 LAB — HM PAP SMEAR: HM Pap smear: NORMAL

## 2022-09-02 LAB — RESULTS CONSOLE HPV: CHL HPV: NEGATIVE

## 2022-09-05 ENCOUNTER — Other Ambulatory Visit: Payer: Self-pay

## 2022-09-05 MED ORDER — EMGALITY 120 MG/ML ~~LOC~~ SOAJ
SUBCUTANEOUS | 2 refills | Status: AC
  Filled 2022-09-05: qty 1, 30d supply, fill #0

## 2022-09-10 ENCOUNTER — Ambulatory Visit
Admission: EM | Admit: 2022-09-10 | Discharge: 2022-09-10 | Disposition: A | Discharge status: Home / Self Care | Payer: Commercial Managed Care - PPO | Attending: Emergency Medicine | Admitting: Emergency Medicine

## 2022-09-10 DIAGNOSIS — T7840XA Allergy, unspecified, initial encounter: Secondary | ICD-10-CM | POA: Diagnosis not present

## 2022-09-10 IMAGING — CR DG ANKLE COMPLETE 3+V*R*
4 series · 4 of 4 positions shown · non-contrast
Comparison: None

CLINICAL DATA: Pain and swelling RIGHT ankle, injury while running

EXAM:
RIGHT ANKLE - COMPLETE 3+ VIEW

[ankle ap (1 of 2)]
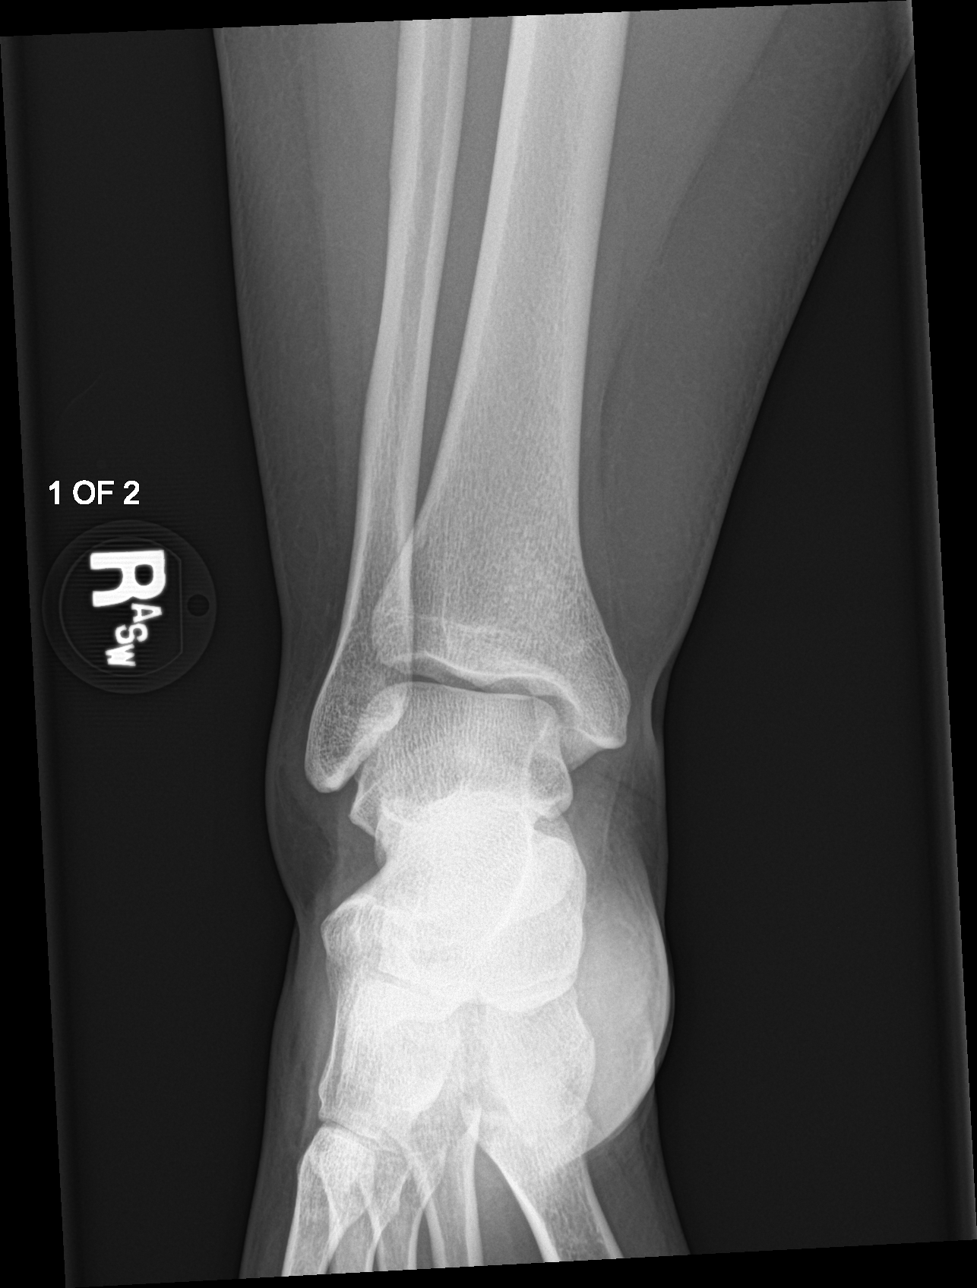

[ankle obl]
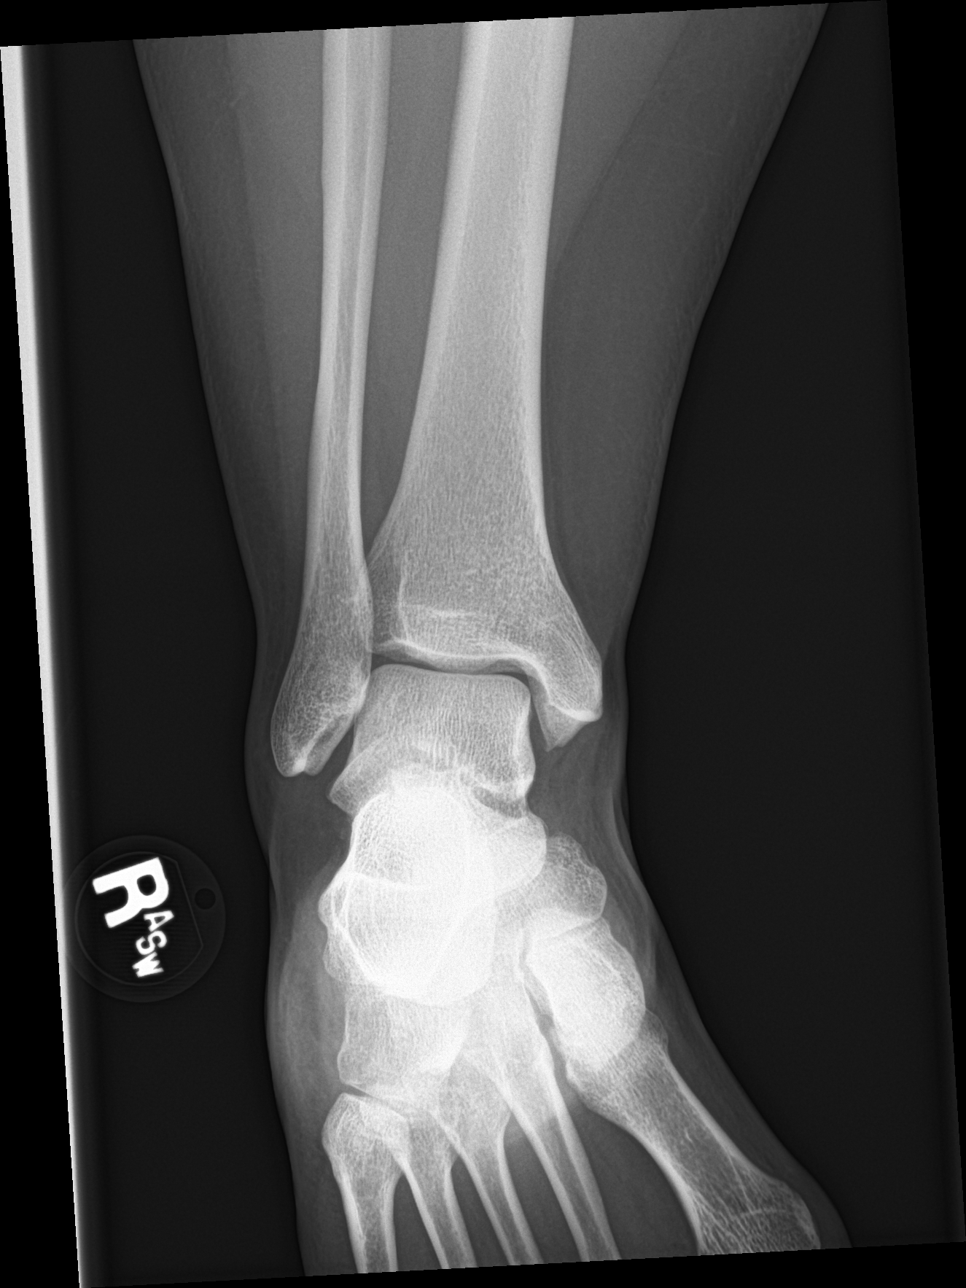

[ankle lat]
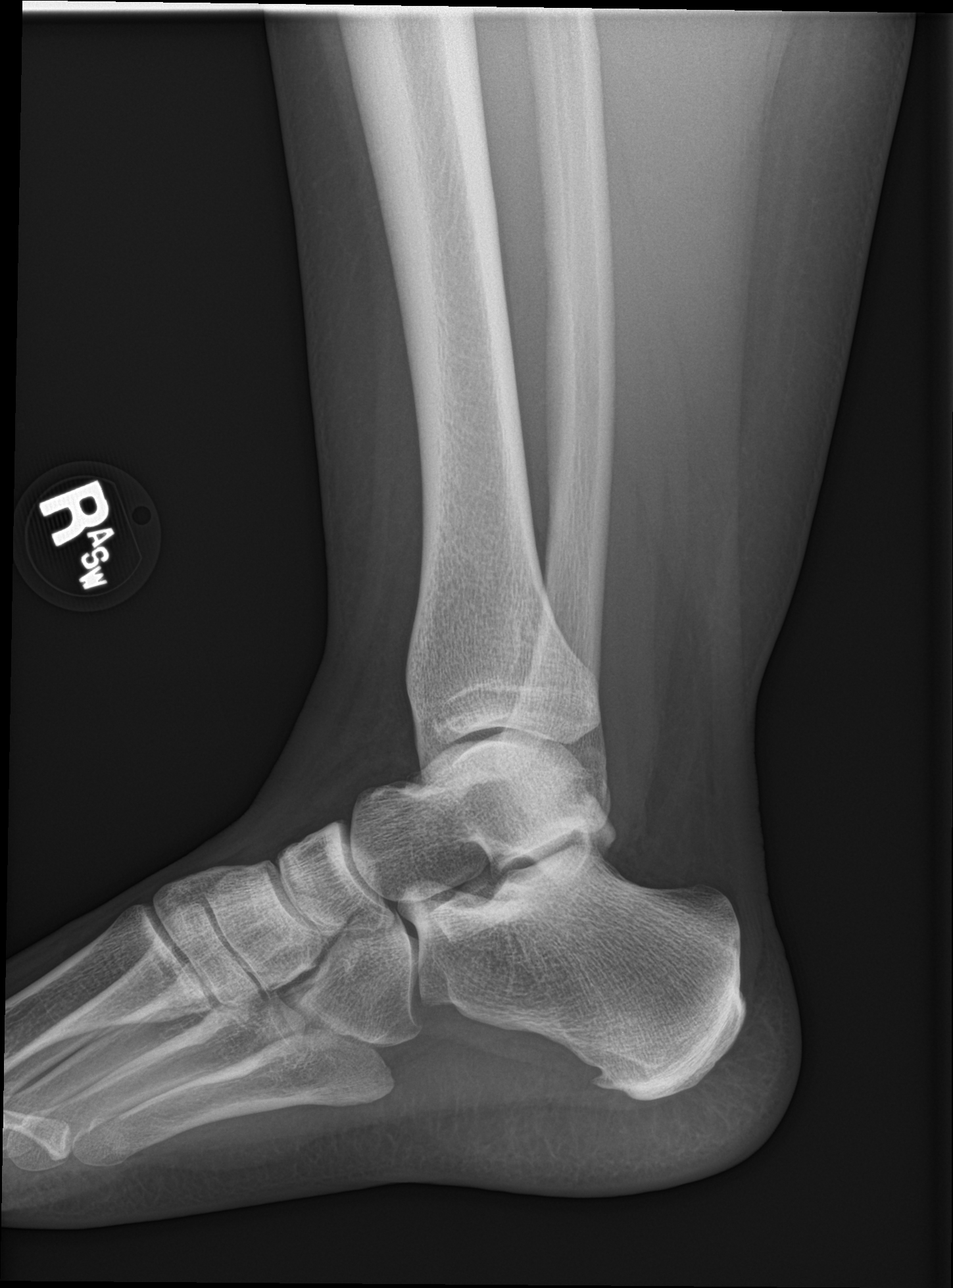

[ankle ap (2 of 2)]
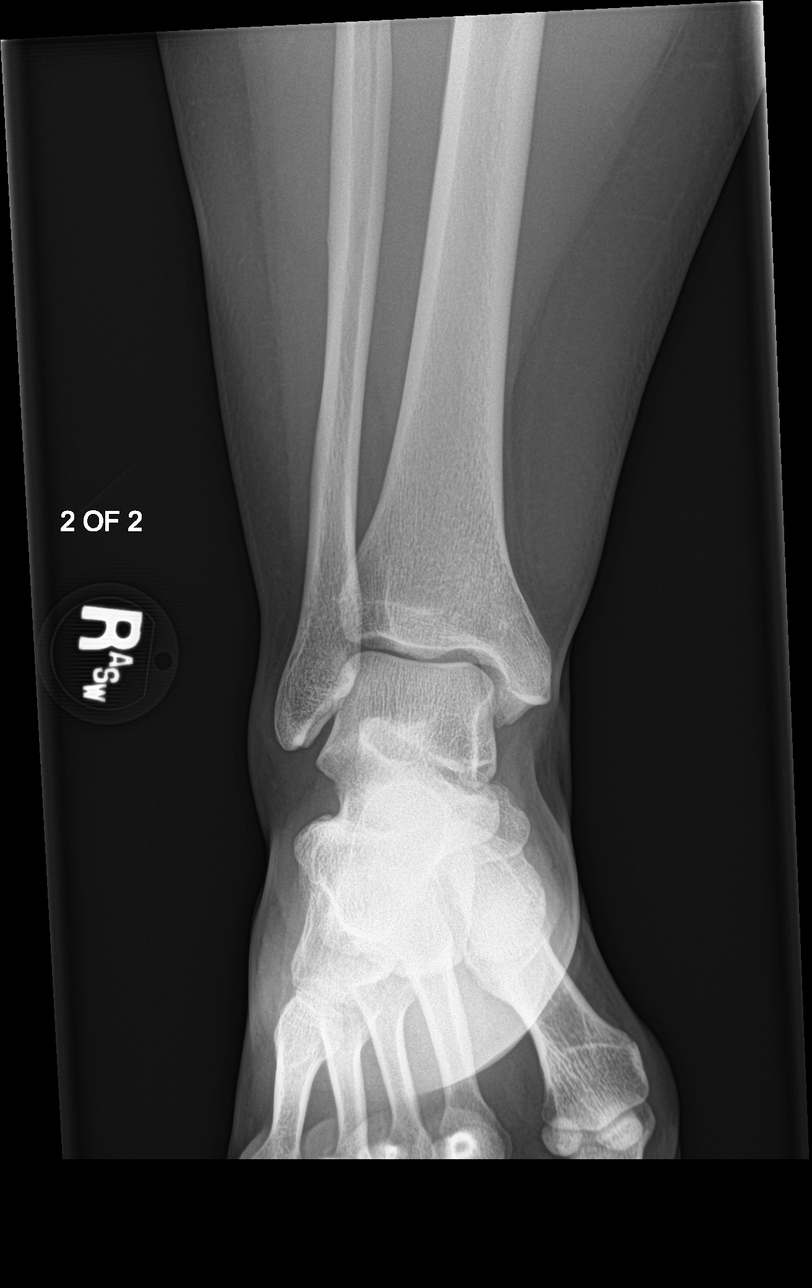

[4 of 4 positions shown; findings below may reference images not displayed]

FINDINGS: Osseous mineralization normal.

Joint spaces preserved.

Tiny plantar calcaneal spur.

No acute fracture, dislocation, or bone destruction.
IMPRESSION: No acute osseous abnormalities.

## 2022-09-10 MED ORDER — DEXAMETHASONE SODIUM PHOSPHATE 10 MG/ML IJ SOLN
10.0000 mg | Freq: Once | INTRAMUSCULAR | Status: AC
  Administered 2022-09-10: 10 mg via INTRAMUSCULAR

## 2022-09-10 MED ORDER — PREDNISONE 10 MG (21) PO TBPK: ORAL_TABLET | ORAL | 0 refills | Status: DC

## 2022-09-10 NOTE — ED Triage Notes (Signed)
Pt presents to UC for rash, pt states she recently got tattoos done last Wednesday, pt states lotion she used did have lightly scented and thinks she's having a reaction to it. Pt states she is washing and cleansing tattoos everyday. Pt states she is allergic to adhesive and noticed rash onset x4 days ago and worsened x2 days ago.

## 2022-09-10 NOTE — Discharge Instructions (Signed)
Take over-the-counter Allegra 180 mg daily or Zyrtec or Claritin 10 mg daily to help with your itching.  You can take over-the-counter Benadryl, 50 mg at bedtime, as needed for itching and sleep.  Take the prednisone pack according to the package instructions.  You will taken on tapering dose over a period of 6 days.  Take it with food and always take it first in the morning with breakfast.  Take over-the-counter Pepcid 20 mg twice daily to help with itching as well.  Stop using the new lotion as this is most likely to cause of your skin rash.  If you develop any swelling of your lips or tongue, tightness in your throat, or difficulty breathing you need to go to the ER for evaluation.

## 2022-09-10 NOTE — ED Provider Notes (Signed)
[facility]    CSN: [id number] Arrival date & time: 09/10/22  1052      History   Chief Complaint Chief Complaint  Patient presents with   Allergic Reaction    Allergic reaction to lotion. Been going on for a few days. Using OTC. Time for steroids. - Entered by patient    HPI Candice Bernard is a 32 y.o. female.   HPI  32 year old female with past medical history of migraines presents for evaluation of skin rash that she first noticed 4 days ago.  She recently got a new tattoo on her right thigh has been using a new lotion that she purchased to keep the area moist as it heals.  She does have an allergy to adhesive and at first she thought it was secondary to the dressing that she had applied but over the last 2 days it has started to spread to her left thigh and right forearm.  She reports that every place she has a rash is where she has used the new lotion.  She reports that she purchased Lubriderm but excellently purchased the lightly scented variety which she has never used.  She denies any tightness in her throat or shortness of breath.  Taking Claritin, Benadryl, and Zyrtec.  Past Medical History:  Diagnosis Date   Migraine     Patient Active Problem List   Diagnosis Date Noted   Patellofemoral pain syndrome of right knee 09/30/2017   Plantar fasciitis of left foot 09/30/2017   Onychocryptosis 04/06/2012   Pain in toe 04/06/2012   Allergic rhinitis due to pollen 10/30/2010   Dysmenorrhea 10/30/2010   Migraine without aura 10/30/2010   Tension type headache 10/30/2010    Past Surgical History:  Procedure Laterality Date   WISDOM TOOTH EXTRACTION      OB History   No obstetric history on file.      Home Medications    Prior to Admission medications   Medication Sig Start Date End Date Taking? Authorizing Provider  predniSONE (STERAPRED UNI-PAK 21 TAB) 10 MG (21) TBPK tablet Take 6 tablets on day 1, 5 tablets day 2, 4 tablets day 3, 3  tablets day 4, 2 tablets day 5, 1 tablet day 6 09/10/22  Yes Becky Augusta, NP  chlorzoxazone (PARAFON) 500 MG tablet Take 500 mg by mouth every 6 (six) hours as needed for muscle spasms. Dr Oneida Arenas    [provider]  chlorzoxazone (PARAFON) 500 MG tablet 1 TAB PRN headache. 04/08/21     chlorzoxazone (PARAFON) 500 MG tablet Take 1 tablet (500 mg total) by mouth as needed for headache 05/26/22     fluticasone (FLONASE) 50 MCG/ACT nasal spray Place into the nose. 02/14/15   [provider]  Galcanezumab-gnlm (EMGALITY) 120 MG/ML SOAJ Dispense ONE- 120 mg AUTOINJECTOR for migraine prevention once per month 12/24/21     Galcanezumab-gnlm (EMGALITY) 120 MG/ML SOAJ Dispense ONE- 120 mg AUTOINJECTOR for migraine prevention once per month 02/19/22     Galcanezumab-gnlm (EMGALITY) 120 MG/ML SOAJ Inject 120 mg into the skin every 30 (thirty) days for migraine prevention 05/26/22     Galcanezumab-gnlm (EMGALITY) 120 MG/ML SOAJ Dispense ONE- 120 mg AUTOINJECTOR for migraine prevention once per month 09/05/22     levETIRAcetam (KEPPRA) 1000 MG tablet Take 1,000 mg by mouth 2 (two) times daily.    [provider]  levETIRAcetam (KEPPRA) 1000 MG tablet take 1 tablet by oral route 2 times a day for 30 days 04/08/21  levETIRAcetam (KEPPRA) 1000 MG tablet take 1 tablet by oral route 2 times a day for 30 days 06/25/21     levETIRAcetam (KEPPRA) 500 MG tablet take 2 tablets by oral route daily for 30 days 12/24/21     levonorgestrel-ethinyl estradiol (SEASONALE) 0.15-0.03 MG tablet Take 1 tablet by mouth daily. 07/25/22   Duanne Limerick, MD  rizatriptan (MAXALT) 10 MG tablet Take 10 mg by mouth as needed for migraine. May repeat in 2 hours if needed - Dr Oneida Arenas    [provider]  rizatriptan (MAXALT-MLT) 10 MG disintegrating tablet TAKE 1 TABLET BY MOUTH AS NEEDED FOR MIGRAINE; MAY REPEAT ONCE AFTER 2 HOURS. 07/18/20 07/18/21  Santiago Glad, MD  rizatriptan (MAXALT-MLT) 10 MG  disintegrating tablet 1 TAB PRN migraine; may repeat once after 2 hours 04/08/21     rizatriptan (MAXALT-MLT) 10 MG disintegrating tablet 1 TAB PRN migraine; may repeat once after 2 hours 06/25/21     rizatriptan (MAXALT-MLT) 10 MG disintegrating tablet 1 TAB PRN migraine; may repeat once after 2 hours 09/24/21     rizatriptan (MAXALT-MLT) 10 MG disintegrating tablet Dissolve 1 tablet by mouth as needed for migraine. May repeat once after 2 hours. 12/24/21     rizatriptan (MAXALT-MLT) 10 MG disintegrating tablet Take 1 tablet by mouth as needed for migraine, may repeat once after 2 hours 02/19/22     rizatriptan (MAXALT-MLT) 10 MG disintegrating tablet Take 1 tablet (10 mg total) by mouth as needed for migraine. may repeat once after 2 hours 05/26/22     zonisamide (ZONEGRAN) 100 MG capsule Take 1 capsule (100 mg total) by mouth daily. 05/22/22     zonisamide (ZONEGRAN) 50 MG capsule Take 3 capsules (150 mg total) by mouth daily. 05/26/22       Family History Family History  Problem Relation Age of Onset   Colitis Father     Social History Social History   Tobacco Use   Smoking status: Never   Smokeless tobacco: Never  Vaping Use   Vaping Use: Never used  Substance Use Topics   Alcohol use: Yes    Alcohol/week: 0.0 standard drinks of alcohol    Comment: rare   Drug use: No     Allergies   Patient has no known allergies.   Review of Systems Review of Systems  HENT:  Negative for sore throat, trouble swallowing and voice change.   Respiratory:  Negative for shortness of breath, wheezing and stridor.   Skin:  Positive for rash.     Physical Exam Triage Vital Signs ED Triage Vitals  Enc Vitals Group     BP 09/10/22 1116 (!) 128/96     Pulse Rate 09/10/22 1116 (!) 111     Resp --      Temp 09/10/22 1116 98.5 F (36.9 C)     Temp Source 09/10/22 1116 Oral     SpO2 09/10/22 1116 99 %     Weight 09/10/22 1114 170 lb (77.1 kg)     Height 09/10/22 1114 5' (1.524 m)     Head  Circumference --      Peak Flow --      Pain Score 09/10/22 1114 0     Pain Loc --      Pain Edu? --      Excl. in GC? --    No data found.  Updated Vital Signs BP (!) 128/96 (BP Location: Left Arm)   Pulse (!) 111   Temp 98.5  F (36.9 C) (Oral)   Ht 5' (1.524 m)   Wt 170 lb (77.1 kg)   LMP 04/22/2022 (Approximate)   SpO2 99%   BMI 33.20 kg/m   Visual Acuity Right Eye Distance:   Left Eye Distance:   Bilateral Distance:    Right Eye Near:   Left Eye Near:    Bilateral Near:     Physical Exam Vitals and nursing note reviewed.  Constitutional:      Appearance: Normal appearance. She is not ill-appearing.  HENT:     Head: Normocephalic and atraumatic.     Mouth/Throat:     Mouth: Mucous membranes are moist.     Pharynx: Oropharynx is clear. No oropharyngeal exudate or posterior oropharyngeal erythema.  Cardiovascular:     Rate and Rhythm: Normal rate and regular rhythm.     Pulses: Normal pulses.     Heart sounds: Normal heart sounds. No murmur heard.    No friction rub. No gallop.  Pulmonary:     Effort: Pulmonary effort is normal.     Breath sounds: Normal breath sounds. No stridor. No wheezing, rhonchi or rales.  Skin:    General: Skin is warm and dry.     Capillary Refill: Capillary refill takes less than 2 seconds.     Findings: No erythema or rash.  Neurological:     General: No focal deficit present.     Mental Status: She is alert and oriented to person, place, and time.      UC Treatments / Results  Labs (all labs ordered are listed, but only abnormal results are displayed) Labs Reviewed - No data to display  EKG   Radiology No results found.  Procedures Procedures (including critical care time)  Medications Ordered in UC Medications  dexamethasone (DECADRON) injection 10 mg (has no administration in time range)    Initial Impression / Assessment and Plan / UC Course  I have reviewed the triage vital signs and the nursing  notes.  Pertinent labs & imaging results that were available during my care of the patient were reviewed by me and considered in my medical decision making (see chart for details).   Patient is a very pleasant, nontoxic-appearing 48 old female presenting for evaluation of an erythematous itchy rash on both thighs and right forearm that she noticed 4 days ago around a new tattoo on her right thigh.  The rash is since spread to both lower extremities and right forearm.  She reports that she is experiencing a rash everywhere that she is applied a new lotion that she purchased.      As you can see in images above the rash is erythematous and maculopapular but it is blanchable.  It is not hot there is no fluctuance or induration.  She has no oropharyngeal edema and she is able to speak in full sentence without dyspnea or tachypnea.  No stridor when asked any over the trachea and her lung sounds are clear to auscultation all fields.  I suspect that she is having a topical allergic reaction to the lightly scented lotion.  I have encouraged her to stop using the lotion and continue to use the Zyrtec and Claritin during the day and Benadryl at night to help with itching.  I have also advised her to add on Pepcid 20 mg twice daily to help with itching.  I will order an injection of Decadron to be administered in clinic followed by a prednisone taper that she can start  tomorrow morning at breakfast.  Return and ER precautions reviewed.   Final Clinical Impressions(s) / UC Diagnoses   Final diagnoses:  Allergic reaction, initial encounter     Discharge Instructions      Take over-the-counter Allegra 180 mg daily or Zyrtec or Claritin 10 mg daily to help with your itching.  You can take over-the-counter Benadryl, 50 mg at bedtime, as needed for itching and sleep.  Take the prednisone pack according to the package instructions.  You will taken on tapering dose over a period of 6 days.  Take it with food  and always take it first in the morning with breakfast.  Take over-the-counter Pepcid 20 mg twice daily to help with itching as well.  Stop using the new lotion as this is most likely to cause of your skin rash.  If you develop any swelling of your lips or tongue, tightness in your throat, or difficulty breathing you need to go to the ER for evaluation.      ED Prescriptions     Medication Sig Dispense Auth. Provider   predniSONE (STERAPRED UNI-PAK 21 TAB) 10 MG (21) TBPK tablet Take 6 tablets on day 1, 5 tablets day 2, 4 tablets day 3, 3 tablets day 4, 2 tablets day 5, 1 tablet day 6 21 tablet Becky Augusta, NP      PDMP not reviewed this encounter.   Becky Augusta, NP 09/10/22 1133

## 2022-09-16 ENCOUNTER — Other Ambulatory Visit: Payer: Self-pay

## 2022-09-16 DIAGNOSIS — G43719 Chronic migraine without aura, intractable, without status migrainosus: Secondary | ICD-10-CM | POA: Diagnosis not present

## 2022-09-16 MED ORDER — ZONISAMIDE 50 MG PO CAPS
150.0000 mg | ORAL_CAPSULE | Freq: Every day | ORAL | 5 refills | Status: DC
  Filled 2022-09-16: qty 90, 30d supply, fill #0
  Filled 2022-10-15: qty 90, 30d supply, fill #1
  Filled 2022-11-10: qty 90, 30d supply, fill #2
  Filled 2022-12-10: qty 90, 30d supply, fill #3
  Filled 2023-01-08: qty 90, 30d supply, fill #4
  Filled 2023-02-10: qty 90, 30d supply, fill #5

## 2022-09-16 MED ORDER — RIZATRIPTAN BENZOATE 10 MG PO TBDP
10.0000 mg | ORAL_TABLET | ORAL | 4 refills | Status: AC | PRN
  Filled 2022-09-16: qty 6, 30d supply, fill #0

## 2022-09-16 MED ORDER — EMGALITY 120 MG/ML ~~LOC~~ SOAJ
120.0000 mg | SUBCUTANEOUS | 5 refills | Status: AC
  Filled 2022-09-16 – 2022-10-02 (×2): qty 1, 30d supply, fill #0
  Filled 2022-10-29 – 2022-11-03 (×2): qty 1, 30d supply, fill #1
  Filled 2022-12-03: qty 1, 30d supply, fill #2
  Filled 2023-01-06: qty 1, 30d supply, fill #3
  Filled 2023-02-04: qty 1, 30d supply, fill #4
  Filled 2023-03-04: qty 1, 30d supply, fill #5

## 2022-09-16 MED ORDER — CHLORZOXAZONE 500 MG PO TABS
500.0000 mg | ORAL_TABLET | ORAL | 4 refills | Status: AC | PRN
  Filled 2022-09-16: qty 30, 30d supply, fill #0

## 2022-10-02 ENCOUNTER — Other Ambulatory Visit: Payer: Self-pay

## 2022-10-08 ENCOUNTER — Other Ambulatory Visit: Payer: Self-pay | Admitting: Family Medicine

## 2022-10-08 DIAGNOSIS — Z3041 Encounter for surveillance of contraceptive pills: Secondary | ICD-10-CM

## 2022-10-09 ENCOUNTER — Other Ambulatory Visit: Payer: Self-pay

## 2022-10-09 MED ORDER — LEVONORGEST-ETH ESTRAD 91-DAY 0.15-0.03 MG PO TABS
1.0000 | ORAL_TABLET | Freq: Every day | ORAL | 0 refills | Status: DC
Start: 2022-10-09 — End: 2023-01-12
  Filled 2022-10-09: qty 91, 91d supply, fill #0

## 2022-10-10 ENCOUNTER — Other Ambulatory Visit: Payer: Self-pay

## 2022-10-29 ENCOUNTER — Other Ambulatory Visit: Payer: Self-pay

## 2022-11-03 ENCOUNTER — Other Ambulatory Visit: Payer: Self-pay

## 2022-12-30 DIAGNOSIS — F419 Anxiety disorder, unspecified: Secondary | ICD-10-CM | POA: Insufficient documentation

## 2023-01-06 ENCOUNTER — Other Ambulatory Visit: Payer: Self-pay | Admitting: Family Medicine

## 2023-01-06 DIAGNOSIS — Z3041 Encounter for surveillance of contraceptive pills: Secondary | ICD-10-CM

## 2023-01-08 ENCOUNTER — Other Ambulatory Visit: Payer: Self-pay

## 2023-01-08 ENCOUNTER — Other Ambulatory Visit: Payer: Self-pay | Admitting: Family Medicine

## 2023-01-08 DIAGNOSIS — Z3041 Encounter for surveillance of contraceptive pills: Secondary | ICD-10-CM

## 2023-01-09 ENCOUNTER — Other Ambulatory Visit: Payer: Self-pay

## 2023-01-12 ENCOUNTER — Other Ambulatory Visit: Payer: Self-pay

## 2023-01-12 MED FILL — Levonorgestrel & Ethinyl Estradiol (91-Day) Tab 0.15-0.03 MG: ORAL | 91 days supply | Qty: 91 | Fill #0 | Status: AC

## 2023-01-12 NOTE — Telephone Encounter (Signed)
Requested Prescriptions  Pending Prescriptions Disp Refills   levonorgestrel-ethinyl estradiol (SEASONALE) 0.15-0.03 MG tablet 90 tablet 0    Sig: Take 1 tablet by mouth daily.     OB/GYN:  Contraceptives Failed - 01/08/2023  5:50 PM      Failed - Last BP in normal range    BP Readings from Last 1 Encounters:  09/10/22 (!) 128/96         Passed - Valid encounter within last 12 months    Recent Outpatient Visits           5 months ago Annual physical exam   [place] [facility]   1 year ago Annual physical exam   [place] [facility], MD   3 years ago Annual physical exam   [place] [facility], MD   4 years ago Encounter for physical examination of prospective foster parent   [facility]   4 years ago Encounter for surveillance of contraceptive pills   [place] [facility], MD       Future Appointments             In 7 months Duanne Limerick, MD [facility] at [facility], [facility]            Passed - Patient is not a smoker

## 2023-03-04 ENCOUNTER — Other Ambulatory Visit: Payer: Self-pay

## 2023-03-09 ENCOUNTER — Other Ambulatory Visit: Payer: Self-pay

## 2023-03-09 DIAGNOSIS — G43719 Chronic migraine without aura, intractable, without status migrainosus: Secondary | ICD-10-CM | POA: Diagnosis not present

## 2023-03-09 MED ORDER — EMGALITY 120 MG/ML ~~LOC~~ SOAJ
120.0000 mg | SUBCUTANEOUS | 5 refills | Status: AC
  Filled 2023-03-09 – 2023-04-05 (×2): qty 1, 30d supply, fill #0
  Filled 2023-05-03: qty 1, 30d supply, fill #1
  Filled 2023-06-09: qty 1, 30d supply, fill #2
  Filled 2023-07-06: qty 1, 30d supply, fill #3
  Filled 2023-08-09: qty 1, 30d supply, fill #4
  Filled 2023-09-09: qty 1, 30d supply, fill #5

## 2023-03-09 MED ORDER — RIZATRIPTAN BENZOATE 10 MG PO TBDP
10.0000 mg | ORAL_TABLET | ORAL | 5 refills | Status: AC | PRN
  Filled 2023-03-09: qty 6, 30d supply, fill #0

## 2023-03-09 MED ORDER — CHLORZOXAZONE 500 MG PO TABS
500.0000 mg | ORAL_TABLET | ORAL | 4 refills | Status: AC | PRN
  Filled 2023-03-09: qty 30, 30d supply, fill #0

## 2023-03-09 MED ORDER — ZONISAMIDE 100 MG PO CAPS
200.0000 mg | ORAL_CAPSULE | Freq: Every day | ORAL | 5 refills | Status: DC
  Filled 2023-03-09: qty 60, 30d supply, fill #0
  Filled 2023-04-05: qty 60, 30d supply, fill #1
  Filled 2023-05-03: qty 60, 30d supply, fill #2
  Filled 2023-06-09: qty 60, 30d supply, fill #3
  Filled 2023-07-06: qty 60, 30d supply, fill #4
  Filled 2023-08-09: qty 60, 30d supply, fill #5

## 2023-04-05 ENCOUNTER — Other Ambulatory Visit: Payer: Self-pay

## 2023-04-05 ENCOUNTER — Other Ambulatory Visit: Payer: Self-pay | Admitting: Family Medicine

## 2023-04-05 DIAGNOSIS — Z3041 Encounter for surveillance of contraceptive pills: Secondary | ICD-10-CM

## 2023-04-06 ENCOUNTER — Other Ambulatory Visit: Payer: Self-pay

## 2023-04-06 MED FILL — Levonorgestrel & Ethinyl Estradiol (91-Day) Tab 0.15-0.03 MG: ORAL | 91 days supply | Qty: 91 | Fill #0 | Status: AC

## 2023-04-08 DIAGNOSIS — Z3009 Encounter for other general counseling and advice on contraception: Secondary | ICD-10-CM | POA: Diagnosis not present

## 2023-05-03 ENCOUNTER — Other Ambulatory Visit: Payer: Self-pay

## 2023-05-04 ENCOUNTER — Other Ambulatory Visit: Payer: Self-pay

## 2023-05-05 DIAGNOSIS — Z30430 Encounter for insertion of intrauterine contraceptive device: Secondary | ICD-10-CM | POA: Diagnosis not present

## 2023-05-05 DIAGNOSIS — Z3200 Encounter for pregnancy test, result unknown: Secondary | ICD-10-CM | POA: Diagnosis not present

## 2023-06-02 DIAGNOSIS — Z30431 Encounter for routine checking of intrauterine contraceptive device: Secondary | ICD-10-CM | POA: Diagnosis not present

## 2023-08-17 ENCOUNTER — Ambulatory Visit (INDEPENDENT_AMBULATORY_CARE_PROVIDER_SITE_OTHER): Payer: Self-pay | Admitting: Family Medicine

## 2023-08-17 ENCOUNTER — Encounter: Payer: Self-pay | Admitting: Family Medicine

## 2023-08-17 VITALS — BP 122/78 | HR 83 | Resp 16 | Ht 60.0 in | Wt 172.3 lb

## 2023-08-17 DIAGNOSIS — R718 Other abnormality of red blood cells: Secondary | ICD-10-CM

## 2023-08-17 DIAGNOSIS — Z0000 Encounter for general adult medical examination without abnormal findings: Secondary | ICD-10-CM

## 2023-08-17 NOTE — Progress Notes (Signed)
 Date:  08/17/2023   Name:  Candice Bernard   DOB:  [birth year]   MRN:  [id number]   Chief Complaint: Annual Exam  Patient is a 33 year old female who presents for a comprehensive physical exam. The patient reports the following problems: none. Health maintenance has been reviewed pap 09/02/22.      Lab Results  Component Value Date   NA 139 08/12/2022   K 4.4 08/12/2022   CO2 18 (L) 08/12/2022   GLUCOSE 103 (H) 08/12/2022   BUN 8 08/12/2022   CREATININE 0.80 08/12/2022   CALCIUM 9.4 08/12/2022   EGFR 102 08/12/2022   GFRNONAA 111 11/25/2019   Lab Results  Component Value Date   CHOL 157 08/12/2022   HDL 55 08/12/2022   LDLCALC 82 08/12/2022   TRIG 111 08/12/2022   CHOLHDL 2.6 08/17/2017   No results found for: "TSH" No results found for: "HGBA1C" Lab Results  Component Value Date   WBC 5.4 08/05/2021   HGB 11.5 08/05/2021   HCT 35.5 08/05/2021   MCV 82 08/05/2021   PLT 304 08/05/2021   Lab Results  Component Value Date   ALT 14 08/12/2022   AST 17 08/12/2022   ALKPHOS 100 08/12/2022   BILITOT 0.4 08/12/2022   No results found for: "25OHVITD2", "25OHVITD3", "VD25OH"   Review of Systems  Constitutional: Negative.  Negative for chills, fatigue, fever and unexpected weight change.  HENT:  Negative for congestion, ear discharge, ear pain, rhinorrhea, sinus pressure, sneezing and sore throat.   Respiratory:  Negative for cough, shortness of breath, wheezing and stridor.   Gastrointestinal:  Negative for abdominal pain, blood in stool, constipation, diarrhea and nausea.  Genitourinary:  Negative for dysuria, flank pain, frequency, hematuria, urgency and vaginal discharge.  Musculoskeletal:  Negative for arthralgias, back pain and myalgias.  Skin:  Negative for rash.  Neurological:  Negative for dizziness, weakness and headaches.  Hematological:  Negative for adenopathy. Does not bruise/bleed easily.  Psychiatric/Behavioral:  Negative for dysphoric mood. The  patient is not nervous/anxious.     Patient Active Problem List   Diagnosis Date Noted   Anxiety disorder, unspecified 12/30/2022   Pelvic pain in female 09/24/2021   Patellofemoral pain syndrome of right knee 09/30/2017   Plantar fasciitis of left foot 09/30/2017   Onychocryptosis 04/06/2012   Pain in toe 04/06/2012   Allergic rhinitis due to pollen 10/30/2010   Dysmenorrhea 10/30/2010   Migraine without aura 10/30/2010   Tension type headache 10/30/2010    No Known Allergies  Past Surgical History:  Procedure Laterality Date   WISDOM TOOTH EXTRACTION      Social History   Tobacco Use   Smoking status: Never   Smokeless tobacco: Never  Vaping Use   Vaping status: Never Used  Substance Use Topics   Alcohol use: Yes    Alcohol/week: 0.0 standard drinks of alcohol    Comment: rare   Drug use: No     Medication list has been reviewed and updated.  Current Meds  Medication Sig   chlorzoxazone  (PARAFON ) 500 MG tablet Take 500 mg by mouth every 6 (six) hours as needed for muscle spasms. Dr Nyoka Bellini   chlorzoxazone  (PARAFON ) 500 MG tablet 1 TAB PRN headache.   chlorzoxazone  (PARAFON ) 500 MG tablet Take 1 tablet (500 mg total) by mouth as needed for headache   chlorzoxazone  (PARAFON ) 500 MG tablet Take 1 tablet (500 mg total) by mouth as needed for headache.   chlorzoxazone  (  PARAFON ) 500 MG tablet Take 1 tablet (500 mg total) by mouth as needed for headache   fluticasone (FLONASE) 50 MCG/ACT nasal spray Place into the nose.   Galcanezumab -gnlm (EMGALITY ) 120 MG/ML SOAJ Dispense ONE- 120 mg AUTOINJECTOR for migraine prevention once per month   levonorgestrel  (MIRENA ) 20 MCG/DAY IUD 1 each by Intrauterine route once. Placed Jan 2025   rizatriptan  (MAXALT ) 10 MG tablet Take 10 mg by mouth as needed for migraine. May repeat in 2 hours if needed - Dr Elliott Lewis   rizatriptan  (MAXALT -MLT) 10 MG disintegrating tablet 1 TAB PRN migraine; may repeat once after 2 hours    rizatriptan  (MAXALT -MLT) 10 MG disintegrating tablet 1 TAB PRN migraine; may repeat once after 2 hours   rizatriptan  (MAXALT -MLT) 10 MG disintegrating tablet 1 TAB PRN migraine; may repeat once after 2 hours   rizatriptan  (MAXALT -MLT) 10 MG disintegrating tablet Dissolve 1 tablet by mouth as needed for migraine. May repeat once after 2 hours.   rizatriptan  (MAXALT -MLT) 10 MG disintegrating tablet Take 1 tablet by mouth as needed for migraine, may repeat once after 2 hours   rizatriptan  (MAXALT -MLT) 10 MG disintegrating tablet Take 1 tablet (10 mg total) by mouth as needed for migraine. may repeat once after 2 hours   rizatriptan  (MAXALT -MLT) 10 MG disintegrating tablet Take 1 tablet (10 mg total) by mouth as needed for migraine. May repeat once after 2 hours   rizatriptan  (MAXALT -MLT) 10 MG disintegrating tablet Take 1 tablet (10 mg total) by mouth as needed for migraine. May repeat once after 2 hours.   zonisamide  (ZONEGRAN ) 100 MG capsule Take 1 capsule (100 mg total) by mouth daily.   zonisamide  (ZONEGRAN ) 100 MG capsule Take 2 capsules (200 mg total) by mouth daily for 30 days.       08/11/2022    8:38 AM 08/05/2021    9:45 AM 11/25/2019    9:42 AM  GAD 7 : Generalized Anxiety Score  Nervous, Anxious, on Edge 0 0 0  Control/stop worrying 0 0 0  Worry too much - different things 0 0 0  Trouble relaxing 0 0 1  Restless 0 0 0  Easily annoyed or irritable 0 0 0  Afraid - awful might happen 0 0 0  Total GAD 7 Score 0 0 1  Anxiety Difficulty Not difficult at all Not difficult at all Not difficult at all       08/17/2023    8:30 AM 08/11/2022    8:37 AM 08/05/2021    9:44 AM  Depression screen PHQ 2/9  Decreased Interest 1 0 0  Down, Depressed, Hopeless 1 0 0  PHQ - 2 Score 2 0 0  Altered sleeping 3 2 0  Tired, decreased energy 3 0 0  Change in appetite 0 0 0  Feeling bad or failure about yourself  1 0 0  Trouble concentrating 0 0 0  Moving slowly or fidgety/restless 0 0 0  Suicidal  thoughts 0 0 0  PHQ-9 Score 9 2 0  Difficult doing work/chores  Not difficult at all Not difficult at all    BP Readings from Last 3 Encounters:  08/17/23 122/78  09/10/22 (!) 128/96  08/11/22 124/70    Physical Exam Vitals and nursing note reviewed.  Constitutional:      General: She is not in acute distress.    Appearance: She is not diaphoretic.  HENT:     Head: Normocephalic and atraumatic.     Jaw: There is normal jaw  occlusion.     Right Ear: Hearing, tympanic membrane, ear canal and external ear normal.     Left Ear: Hearing, tympanic membrane, ear canal and external ear normal.     Nose: Nose normal. No nasal deformity, congestion or rhinorrhea.     Mouth/Throat:     Lips: Pink.     Mouth: Mucous membranes are moist.     Dentition: Normal dentition.     Tongue: No lesions. Tongue does not deviate from midline.     Palate: No mass.     Pharynx: Oropharynx is clear. Uvula midline. No pharyngeal swelling, oropharyngeal exudate or postnasal drip.     Tonsils: No tonsillar exudate or tonsillar abscesses.  Eyes:     General: Lids are normal. Vision grossly intact. Gaze aligned appropriately.        Right eye: No discharge.        Left eye: No discharge.     Conjunctiva/sclera: Conjunctivae normal.     Pupils: Pupils are equal, round, and reactive to light.     Funduscopic exam:    Right eye: Red reflex present.        Left eye: Red reflex present. Neck:     Thyroid: No thyromegaly.     Vascular: Normal carotid pulses. No carotid bruit, hepatojugular reflux or JVD.     Trachea: Trachea and phonation normal.  Cardiovascular:     Rate and Rhythm: Normal rate and regular rhythm.     Pulses: Normal pulses.          Carotid pulses are 2+ on the right side and 2+ on the left side.      Radial pulses are 2+ on the right side and 2+ on the left side.       Popliteal pulses are 2+ on the right side and 2+ on the left side.       Dorsalis pedis pulses are 2+ on the right side  and 2+ on the left side.       Posterior tibial pulses are 2+ on the right side and 2+ on the left side.     Heart sounds: Normal heart sounds, S1 normal and S2 normal. No murmur heard.    No systolic murmur is present.     No diastolic murmur is present.     No friction rub. No gallop. No S3 or S4 sounds.  Pulmonary:     Effort: Pulmonary effort is normal.     Breath sounds: Normal breath sounds. No decreased breath sounds, wheezing, rhonchi or rales.  Chest:  Breasts:    Right: Normal. No mass.     Left: Normal. No mass.  Abdominal:     General: Bowel sounds are normal. There is no distension.     Palpations: Abdomen is soft. There is no hepatomegaly, splenomegaly or mass.     Tenderness: There is no abdominal tenderness. There is no guarding.     Hernia: No hernia is present.  Musculoskeletal:        General: Normal range of motion.     Cervical back: Neck supple.     Right lower leg: No edema.     Left lower leg: No edema.  Lymphadenopathy:     Cervical: No cervical adenopathy.     Right cervical: No superficial, deep or posterior cervical adenopathy.    Left cervical: No superficial, deep or posterior cervical adenopathy.     Upper Body:     Right upper body: No supraclavicular  or axillary adenopathy.     Left upper body: No supraclavicular or axillary adenopathy.  Skin:    General: Skin is warm and dry.     Capillary Refill: Capillary refill takes less than 2 seconds.  Neurological:     Mental Status: She is alert.     Cranial Nerves: Cranial nerves 2-12 are intact.     Sensory: Sensation is intact.     Motor: Motor function is intact.     Coordination: Romberg sign negative.     Deep Tendon Reflexes: Reflexes are normal and symmetric.  Psychiatric:        Behavior: Behavior is cooperative.     Wt Readings from Last 3 Encounters:  08/17/23 172 lb 4.8 oz (78.2 kg)  09/10/22 170 lb (77.1 kg)  08/11/22 164 lb (74.4 kg)    BP 122/78   Pulse 83   Resp 16   Ht 5'  (1.524 m)   Wt 172 lb 4.8 oz (78.2 kg)   SpO2 99%   BMI 33.65 kg/m   Assessment and Plan: MAIGAN BREMNER is a 33 y.o. female who presents today for her Complete Annual Exam. She feels fairly well. She reports exercising /dogwalking/running. She reports she is sleeping poorly. Immunizations are reviewed and recommendations provided.   Age appropriate screening tests are discussed. Counseling given for risk factor reduction interventions.    1. Annual physical exam (Primary) No subjective/objective concerns noted during HPI, review of past medical history/medications/labs within the last year/, review of systems and physical exam.  Will check baseline CMP CBC lipid panel.  Will recheck physical exam in a year. - Comprehensive metabolic panel with GFR - CBC with Differential/Platelet - Lipid Panel With LDL/HDL Ratio  2. Abnormal erythrocyte indices MCH abnormal and we will check CBC to follow-up for possibility of microcytic indices. - CBC with Differential/Platelet    Alayne Allis, MD

## 2023-08-17 NOTE — Patient Instructions (Signed)

## 2023-08-18 ENCOUNTER — Encounter: Payer: Self-pay | Admitting: Family Medicine

## 2023-08-18 LAB — COMPREHENSIVE METABOLIC PANEL WITH GFR
ALT: 45 IU/L — ABNORMAL HIGH (ref 0–32)
AST: 29 IU/L (ref 0–40)
Albumin: 4.7 g/dL (ref 3.9–4.9)
Alkaline Phosphatase: 132 IU/L — ABNORMAL HIGH (ref 44–121)
BUN/Creatinine Ratio: 16 (ref 9–23)
BUN: 11 mg/dL (ref 6–20)
Bilirubin Total: 0.4 mg/dL (ref 0.0–1.2)
CO2: 18 mmol/L — ABNORMAL LOW (ref 20–29)
Calcium: 9.5 mg/dL (ref 8.7–10.2)
Chloride: 109 mmol/L — ABNORMAL HIGH (ref 96–106)
Creatinine, Ser: 0.68 mg/dL (ref 0.57–1.00)
Globulin, Total: 2.7 g/dL (ref 1.5–4.5)
Glucose: 93 mg/dL (ref 70–99)
Potassium: 5.1 mmol/L (ref 3.5–5.2)
Sodium: 140 mmol/L (ref 134–144)
Total Protein: 7.4 g/dL (ref 6.0–8.5)
eGFR: 119 mL/min/{1.73_m2} (ref >59)

## 2023-08-18 LAB — CBC WITH DIFFERENTIAL/PLATELET
Basophils Absolute: 0.1 10*3/uL (ref 0.0–0.2)
Basos: 1 %
EOS (ABSOLUTE): 0.2 10*3/uL (ref 0.0–0.4)
Eos: 3 %
Hematocrit: 38.1 % (ref 34.0–46.6)
Hemoglobin: 12.3 g/dL (ref 11.1–15.9)
Immature Grans (Abs): 0 10*3/uL (ref 0.0–0.1)
Immature Granulocytes: 0 %
Lymphocytes Absolute: 1.8 10*3/uL (ref 0.7–3.1)
Lymphs: 32 %
MCH: 26.2 pg — ABNORMAL LOW (ref 26.6–33.0)
MCHC: 32.3 g/dL (ref 31.5–35.7)
MCV: 81 fL (ref 79–97)
Monocytes Absolute: 0.4 10*3/uL (ref 0.1–0.9)
Monocytes: 7 %
Neutrophils Absolute: 3.2 10*3/uL (ref 1.4–7.0)
Neutrophils: 57 %
Platelets: 316 10*3/uL (ref 150–450)
RBC: 4.69 x10E6/uL (ref 3.77–5.28)
RDW: 14.9 % (ref 11.7–15.4)
WBC: 5.6 10*3/uL (ref 3.4–10.8)

## 2023-08-18 LAB — LIPID PANEL WITH LDL/HDL RATIO
Cholesterol, Total: 155 mg/dL (ref 100–199)
HDL: 52 mg/dL (ref >39)
LDL Chol Calc (NIH): 87 mg/dL (ref 0–99)
LDL/HDL Ratio: 1.7 ratio (ref 0.0–3.2)
Triglycerides: 85 mg/dL (ref 0–149)
VLDL Cholesterol Cal: 16 mg/dL (ref 5–40)

## 2023-08-25 ENCOUNTER — Other Ambulatory Visit: Payer: Self-pay

## 2023-08-25 DIAGNOSIS — G43719 Chronic migraine without aura, intractable, without status migrainosus: Secondary | ICD-10-CM | POA: Diagnosis not present

## 2023-08-25 MED ORDER — CHLORZOXAZONE 500 MG PO TABS
500.0000 mg | ORAL_TABLET | ORAL | 3 refills | Status: AC | PRN
  Filled 2023-08-25: qty 30, 30d supply, fill #0

## 2023-08-25 MED ORDER — RIZATRIPTAN BENZOATE 10 MG PO TBDP
10.0000 mg | ORAL_TABLET | ORAL | 15 refills | Status: AC | PRN
  Filled 2023-08-25: qty 6, 30d supply, fill #0
  Filled 2023-11-26: qty 6, 30d supply, fill #1

## 2023-08-25 MED ORDER — ZONISAMIDE 100 MG PO CAPS
200.0000 mg | ORAL_CAPSULE | Freq: Every day | ORAL | 3 refills | Status: DC
  Filled 2023-08-25 – 2023-09-09 (×2): qty 60, 30d supply, fill #0
  Filled 2023-10-05: qty 60, 30d supply, fill #1
  Filled 2023-11-04: qty 60, 30d supply, fill #2
  Filled 2023-12-16: qty 60, 30d supply, fill #3

## 2023-08-25 MED ORDER — EMGALITY 120 MG/ML ~~LOC~~ SOAJ
120.0000 mg | SUBCUTANEOUS | 3 refills | Status: AC
Start: 2023-08-25 — End: ?
  Filled 2023-08-25 – 2023-10-05 (×2): qty 1, 30d supply, fill #0
  Filled 2023-11-04: qty 1, 30d supply, fill #1
  Filled 2023-12-02 (×2): qty 1, 30d supply, fill #2
  Filled 2024-01-06: qty 1, 30d supply, fill #3

## 2023-08-25 MED ORDER — ZONISAMIDE 50 MG PO CAPS
50.0000 mg | ORAL_CAPSULE | Freq: Every day | ORAL | 3 refills | Status: DC
Start: 2023-08-25 — End: 2023-12-16
  Filled 2023-08-25: qty 30, 30d supply, fill #0
  Filled 2023-09-22: qty 30, 30d supply, fill #1
  Filled 2023-10-27: qty 30, 30d supply, fill #2
  Filled 2023-11-26: qty 30, 30d supply, fill #3

## 2023-08-26 ENCOUNTER — Other Ambulatory Visit: Payer: Self-pay

## 2023-08-31 ENCOUNTER — Other Ambulatory Visit: Payer: Self-pay

## 2023-08-31 ENCOUNTER — Other Ambulatory Visit (HOSPITAL_COMMUNITY): Payer: Self-pay

## 2023-08-31 MED ORDER — BOTOX 100 UNITS IJ SOLR
INTRAMUSCULAR | 3 refills | Status: AC
  Filled 2023-09-01: qty 2, 84d supply, fill #0
  Filled 2023-12-02 – 2023-12-07 (×3): qty 2, 84d supply, fill #1
  Filled 2024-03-08: qty 2, 84d supply, fill #2

## 2023-09-01 ENCOUNTER — Other Ambulatory Visit: Payer: Self-pay

## 2023-09-01 NOTE — Progress Notes (Signed)
 Specialty Pharmacy Initial Fill Coordination Note  Candice Bernard is a 33 y.o. female contacted today regarding initial fill of specialty medication(s) OnabotulinumtoxinA (Botox)   Patient requested Delivery   Delivery date: 10/01/23   Verified address: [facility] [address]   Medication will be filled on 6/11.   Patient is aware of $0 copayment.

## 2023-09-01 NOTE — Progress Notes (Signed)
 Pharmacy Patient Advocate Encounter  Insurance verification completed.   The patient is insured through [facility]   Ran test claim for Botox. Co-pay is $0. Patient has copay card.  This test claim was processed through [facility]- copay amounts may vary at other pharmacies due to pharmacy/plan contracts, or as the patient moves through the different stages of their insurance plan.

## 2023-09-03 ENCOUNTER — Other Ambulatory Visit (HOSPITAL_COMMUNITY): Payer: Self-pay

## 2023-09-09 ENCOUNTER — Other Ambulatory Visit: Payer: Self-pay

## 2023-09-15 ENCOUNTER — Telehealth: Admitting: Physician Assistant

## 2023-09-15 DIAGNOSIS — L237 Allergic contact dermatitis due to plants, except food: Secondary | ICD-10-CM

## 2023-09-15 MED ORDER — PREDNISONE 10 MG PO TABS: ORAL_TABLET | ORAL | 0 refills | Status: AC

## 2023-09-15 NOTE — Progress Notes (Signed)
 E Visit for Rash  We are sorry that you are not feeling well. Here is how we plan to help!  Based on what you shared with me it looks like you have contact dermatitis.  Contact dermatitis is a skin rash caused by something that touches the skin and causes irritation or inflammation.  Your skin may be red, swollen, dry, cracked, and itch.  The rash should go away in a few days but can last a few weeks.  If you get a rash, it's important to figure out what caused it so the irritant can be avoided in the future. and I am prescribing a two week course of steroids (37 tablets of 10 mg prednisone).  Days 1-4 take 4 tablets (40 mg) daily  Days 5-8 take 3 tablets (30 mg) daily, Days 9-11 take 2 tablets (20 mg) daily, Days 12-14 take 1 tablet (10 mg) daily.       HOME CARE:  Take cool showers and avoid direct sunlight. Apply cool compress or wet dressings. Take a bath in an oatmeal bath.  Sprinkle content of one Aveeno packet under running faucet with comfortably warm water.  Bathe for 15-20 minutes, 1-2 times daily.  Pat dry with a towel. Do not rub the rash. Use hydrocortisone cream. Take an antihistamine like Benadryl for widespread rashes that itch.  The adult dose of Benadryl is 25-50 mg by mouth 4 times daily. Caution:  This type of medication may cause sleepiness.  Do not drink alcohol, drive, or operate dangerous machinery while taking antihistamines.  Do not take these medications if you have prostate enlargement.  Read package instructions thoroughly on all medications that you take.  GET HELP RIGHT AWAY IF:  Symptoms don't go away after treatment. Severe itching that persists. If you rash spreads or swells. If you rash begins to smell. If it blisters and opens or develops a yellow-brown crust. You develop a fever. You have a sore throat. You become short of breath.  MAKE SURE YOU:  Understand these instructions. Will watch your condition. Will get help right away if you are not  doing well or get worse.  Thank you for choosing an e-visit.  Your e-visit answers were reviewed by a board certified advanced clinical practitioner to complete your personal care plan. Depending upon the condition, your plan could have included both over the counter or prescription medications.  Please review your pharmacy choice. Make sure the pharmacy is open so you can pick up prescription now. If there is a problem, you may contact your provider through [employer] and have the prescription routed to another pharmacy.  Your safety is important to [place]. If you have drug allergies check your prescription carefully.   For the next 24 hours you can use MyChart to ask questions about today's visit, request a non-urgent call back, or ask for a work or school excuse. You will get an email in the next two days asking about your experience. I hope that your e-visit has been valuable and will speed your recovery.

## 2023-09-15 NOTE — Progress Notes (Signed)
 I have spent 5 minutes in review of e-visit questionnaire, review and updating patient chart, medical decision making and response to patient.   Piedad Climes, PA-C

## 2023-09-16 ENCOUNTER — Other Ambulatory Visit: Payer: Self-pay

## 2023-09-24 ENCOUNTER — Other Ambulatory Visit: Payer: Self-pay

## 2023-10-05 ENCOUNTER — Other Ambulatory Visit: Payer: Self-pay

## 2023-10-07 DIAGNOSIS — M791 Myalgia, unspecified site: Secondary | ICD-10-CM | POA: Diagnosis not present

## 2023-10-07 DIAGNOSIS — G43719 Chronic migraine without aura, intractable, without status migrainosus: Secondary | ICD-10-CM | POA: Diagnosis not present

## 2023-10-07 DIAGNOSIS — M542 Cervicalgia: Secondary | ICD-10-CM | POA: Diagnosis not present

## 2023-11-24 ENCOUNTER — Other Ambulatory Visit: Payer: Self-pay

## 2023-12-02 ENCOUNTER — Other Ambulatory Visit: Payer: Self-pay

## 2023-12-03 ENCOUNTER — Other Ambulatory Visit: Payer: Self-pay

## 2023-12-07 ENCOUNTER — Other Ambulatory Visit: Payer: Self-pay

## 2023-12-07 ENCOUNTER — Other Ambulatory Visit: Payer: Self-pay | Admitting: Pharmacy Technician

## 2023-12-07 NOTE — Progress Notes (Signed)
 Specialty Pharmacy Refill Coordination Note  Candice Bernard is a 33 y.o. female assessed today regarding refills of clinic administered specialty medication(s) OnabotulinumtoxinA  (Botox )   Clinic requested Delivery   Delivery date: 12/29/23   Verified address: [facility] [address], [place], [place] [address]   Medication will be filled on 12/28/23.  Fill 9/8 to MDO for appt on 9/17.  Spoke to [employer]. from [facility], confirmed they are able to receive med from 8am -4 pm LUNCH 12-1 pm. Patient injection appt is on 01/06/24.

## 2023-12-09 ENCOUNTER — Other Ambulatory Visit: Payer: Self-pay

## 2023-12-16 ENCOUNTER — Other Ambulatory Visit: Payer: Self-pay

## 2023-12-16 MED ORDER — ZONISAMIDE 50 MG PO CAPS
50.0000 mg | ORAL_CAPSULE | Freq: Every day | ORAL | 0 refills | Status: DC
  Filled 2023-12-16 – 2023-12-23 (×2): qty 30, 30d supply, fill #0

## 2023-12-17 ENCOUNTER — Other Ambulatory Visit: Payer: Self-pay

## 2023-12-23 ENCOUNTER — Other Ambulatory Visit: Payer: Self-pay

## 2023-12-24 ENCOUNTER — Other Ambulatory Visit: Payer: Self-pay

## 2023-12-28 ENCOUNTER — Other Ambulatory Visit (HOSPITAL_COMMUNITY): Payer: Self-pay

## 2023-12-28 ENCOUNTER — Other Ambulatory Visit: Payer: Self-pay

## 2024-01-06 ENCOUNTER — Other Ambulatory Visit: Payer: Self-pay

## 2024-01-06 MED ORDER — EMGALITY 120 MG/ML ~~LOC~~ SOAJ
120.0000 mg | SUBCUTANEOUS | 0 refills | Status: DC
  Filled 2024-01-06 – 2024-02-03 (×2): qty 1, 30d supply, fill #0

## 2024-01-06 MED ORDER — ZONISAMIDE 50 MG PO CAPS
50.0000 mg | ORAL_CAPSULE | Freq: Every day | ORAL | 2 refills | Status: DC
  Filled 2024-01-06 – 2024-01-17 (×2): qty 30, 30d supply, fill #0
  Filled 2024-02-15: qty 30, 30d supply, fill #1
  Filled 2024-03-15: qty 30, 30d supply, fill #2

## 2024-01-06 MED ORDER — CHLORZOXAZONE 500 MG PO TABS
500.0000 mg | ORAL_TABLET | ORAL | 2 refills | Status: AC | PRN
  Filled 2024-01-06: qty 30, 30d supply, fill #0

## 2024-01-06 MED ORDER — RIZATRIPTAN BENZOATE 10 MG PO TBDP
10.0000 mg | ORAL_TABLET | Freq: Once | ORAL | 3 refills | Status: AC | PRN
  Filled 2024-01-06: qty 6, 15d supply, fill #0

## 2024-01-06 MED ORDER — ZONISAMIDE 100 MG PO CAPS
200.0000 mg | ORAL_CAPSULE | Freq: Every day | ORAL | 3 refills | Status: AC
  Filled 2024-01-06 – 2024-01-17 (×2): qty 60, 30d supply, fill #0
  Filled 2024-02-15: qty 60, 30d supply, fill #1
  Filled 2024-03-15: qty 60, 30d supply, fill #2
  Filled 2024-05-15: qty 60, 30d supply, fill #3

## 2024-01-07 ENCOUNTER — Other Ambulatory Visit: Payer: Self-pay

## 2024-01-18 ENCOUNTER — Other Ambulatory Visit: Payer: Self-pay

## 2024-01-19 ENCOUNTER — Other Ambulatory Visit: Payer: Self-pay

## 2024-02-03 ENCOUNTER — Other Ambulatory Visit: Payer: Self-pay

## 2024-02-22 ENCOUNTER — Other Ambulatory Visit: Payer: Self-pay

## 2024-02-22 DIAGNOSIS — F321 Major depressive disorder, single episode, moderate: Secondary | ICD-10-CM | POA: Diagnosis not present

## 2024-02-22 MED ORDER — SERTRALINE HCL 25 MG PO TABS
25.0000 mg | ORAL_TABLET | Freq: Every day | ORAL | 0 refills | Status: AC
  Filled 2024-02-22: qty 30, 30d supply, fill #0

## 2024-02-22 MED ORDER — SERTRALINE HCL 50 MG PO TABS
50.0000 mg | ORAL_TABLET | Freq: Every day | ORAL | 0 refills | Status: DC
  Filled 2024-03-09: qty 30, 30d supply, fill #0

## 2024-03-02 ENCOUNTER — Other Ambulatory Visit: Payer: Self-pay

## 2024-03-02 ENCOUNTER — Other Ambulatory Visit (HOSPITAL_COMMUNITY): Payer: Self-pay

## 2024-03-06 ENCOUNTER — Other Ambulatory Visit: Payer: Self-pay

## 2024-03-07 ENCOUNTER — Other Ambulatory Visit: Payer: Self-pay

## 2024-03-08 ENCOUNTER — Other Ambulatory Visit: Payer: Self-pay

## 2024-03-08 NOTE — Progress Notes (Signed)
 Specialty Pharmacy Refill Coordination Note  Candice Bernard is a 33 y.o. female assessed today regarding refills of clinic administered specialty medication(s) OnabotulinumtoxinA  (Botox )   Clinic requested Delivery   Delivery date: 03/30/24   Verified address: [facility] [address] [place], [place], [place] [address]   Medication will be filled on: 03/29/24

## 2024-03-09 ENCOUNTER — Other Ambulatory Visit: Payer: Self-pay

## 2024-03-14 ENCOUNTER — Other Ambulatory Visit: Payer: Self-pay

## 2024-03-15 ENCOUNTER — Other Ambulatory Visit: Payer: Self-pay

## 2024-03-15 MED ORDER — EMGALITY 120 MG/ML ~~LOC~~ SOAJ
120.0000 mg | SUBCUTANEOUS | 2 refills | Status: AC
  Filled 2024-03-15: qty 1, 28d supply, fill #0
  Filled 2024-05-18 (×2): qty 1, 28d supply, fill #1
  Filled 2024-05-19: qty 1, 28d supply, fill #0

## 2024-03-21 ENCOUNTER — Other Ambulatory Visit: Payer: Self-pay

## 2024-03-21 DIAGNOSIS — F321 Major depressive disorder, single episode, moderate: Secondary | ICD-10-CM | POA: Diagnosis not present

## 2024-03-21 MED ORDER — SERTRALINE HCL 50 MG PO TABS
50.0000 mg | ORAL_TABLET | Freq: Every day | ORAL | 0 refills | Status: DC
  Filled 2024-04-09: qty 30, 30d supply, fill #0

## 2024-03-29 ENCOUNTER — Other Ambulatory Visit: Payer: Self-pay

## 2024-04-06 ENCOUNTER — Other Ambulatory Visit: Payer: Self-pay

## 2024-04-06 MED ORDER — ZONISAMIDE 50 MG PO CAPS
50.0000 mg | ORAL_CAPSULE | Freq: Every day | ORAL | 2 refills | Status: AC
  Filled 2024-04-06: qty 90, 90d supply, fill #0
  Filled 2024-04-14 – 2024-05-12 (×2): qty 30, 30d supply, fill #0
  Filled 2024-05-12: qty 30, 30d supply, fill #1
  Filled 2024-05-15 – 2024-05-18 (×3): qty 30, 30d supply, fill #0

## 2024-04-06 MED ORDER — RIZATRIPTAN BENZOATE 10 MG PO TBDP
10.0000 mg | ORAL_TABLET | ORAL | 3 refills | Status: AC | PRN
  Filled 2024-04-06: qty 6, 15d supply, fill #0

## 2024-04-06 MED ORDER — CHLORZOXAZONE 500 MG PO TABS
500.0000 mg | ORAL_TABLET | ORAL | 2 refills | Status: AC | PRN
  Filled 2024-04-06: qty 30, 30d supply, fill #0

## 2024-04-06 MED ORDER — EMGALITY 120 MG/ML ~~LOC~~ SOAJ
120.0000 mg | SUBCUTANEOUS | 3 refills | Status: AC
  Filled 2024-04-06: qty 3, 90d supply, fill #0

## 2024-04-06 MED ORDER — ZONISAMIDE 100 MG PO CAPS
200.0000 mg | ORAL_CAPSULE | Freq: Every day | ORAL | 3 refills | Status: AC
  Filled 2024-04-06: qty 180, 90d supply, fill #0
  Filled 2024-04-14: qty 60, 30d supply, fill #0
  Filled 2024-05-18 (×2): qty 60, 30d supply, fill #1

## 2024-04-10 ENCOUNTER — Other Ambulatory Visit: Payer: Self-pay

## 2024-04-15 ENCOUNTER — Other Ambulatory Visit: Payer: Self-pay

## 2024-04-26 ENCOUNTER — Other Ambulatory Visit: Payer: Self-pay

## 2024-04-26 MED ORDER — SERTRALINE HCL 50 MG PO TABS
50.0000 mg | ORAL_TABLET | Freq: Every day | ORAL | 0 refills | Status: DC
  Filled 2024-05-12 (×2): qty 30, 30d supply, fill #0

## 2024-05-12 ENCOUNTER — Other Ambulatory Visit (HOSPITAL_COMMUNITY): Payer: Self-pay

## 2024-05-12 ENCOUNTER — Other Ambulatory Visit: Payer: Self-pay

## 2024-05-13 ENCOUNTER — Other Ambulatory Visit (HOSPITAL_COMMUNITY): Payer: Self-pay

## 2024-05-15 ENCOUNTER — Other Ambulatory Visit (HOSPITAL_COMMUNITY): Payer: Self-pay

## 2024-05-17 ENCOUNTER — Other Ambulatory Visit: Payer: Self-pay

## 2024-05-18 ENCOUNTER — Other Ambulatory Visit: Payer: Self-pay

## 2024-05-18 ENCOUNTER — Other Ambulatory Visit (HOSPITAL_COMMUNITY): Payer: Self-pay

## 2024-05-18 MED ORDER — ZONISAMIDE 50 MG PO CAPS
50.0000 mg | ORAL_CAPSULE | Freq: Every day | ORAL | 0 refills | Status: AC
  Filled 2024-05-18: qty 90, 90d supply, fill #0

## 2024-05-18 MED ORDER — ZONISAMIDE 100 MG PO CAPS
200.0000 mg | ORAL_CAPSULE | Freq: Every day | ORAL | 0 refills | Status: AC
  Filled 2024-05-18: qty 180, 90d supply, fill #0

## 2024-05-19 ENCOUNTER — Other Ambulatory Visit: Payer: Self-pay

## 2024-05-19 ENCOUNTER — Other Ambulatory Visit (HOSPITAL_COMMUNITY): Payer: Self-pay

## 2024-05-19 MED ORDER — SERTRALINE HCL 50 MG PO TABS: 50.0000 mg | ORAL_TABLET | Freq: Every day | ORAL | 0 refills | Status: AC

## 2024-05-25 ENCOUNTER — Other Ambulatory Visit: Payer: Self-pay

## 2024-05-25 ENCOUNTER — Other Ambulatory Visit (HOSPITAL_COMMUNITY): Payer: Self-pay

## 2024-05-25 MED ORDER — SERTRALINE HCL 50 MG PO TABS
50.0000 mg | ORAL_TABLET | Freq: Every day | ORAL | 0 refills | Status: AC
  Filled 2024-05-25: qty 90, 90d supply, fill #0
